# Patient Record
Sex: Female | Born: 1960 | Race: Black or African American | Hispanic: No | Marital: Married | Smoking: Former smoker
Health system: Southern US, Community
[De-identification: ages and names within clinical notes are randomized; demographics above are authoritative.]

## PROBLEM LIST (undated history)

## (undated) DIAGNOSIS — F329 Major depressive disorder, single episode, unspecified: Secondary | ICD-10-CM

## (undated) DIAGNOSIS — D219 Benign neoplasm of connective and other soft tissue, unspecified: Secondary | ICD-10-CM

## (undated) DIAGNOSIS — M1990 Unspecified osteoarthritis, unspecified site: Secondary | ICD-10-CM

## (undated) DIAGNOSIS — M069 Rheumatoid arthritis, unspecified: Secondary | ICD-10-CM

## (undated) DIAGNOSIS — D649 Anemia, unspecified: Secondary | ICD-10-CM

## (undated) DIAGNOSIS — R51 Headache: Secondary | ICD-10-CM

## (undated) DIAGNOSIS — F32A Depression, unspecified: Secondary | ICD-10-CM

## (undated) DIAGNOSIS — F419 Anxiety disorder, unspecified: Secondary | ICD-10-CM

## (undated) HISTORY — DX: Major depressive disorder, single episode, unspecified: F32.9

## (undated) HISTORY — DX: Depression, unspecified: F32.A

## (undated) HISTORY — PX: APPENDECTOMY: SHX54

## (undated) HISTORY — PX: ABDOMINAL SURGERY: SHX537

---

## 2008-06-17 ENCOUNTER — Emergency Department (HOSPITAL_COMMUNITY): Admission: EM | Admit: 2008-06-17 | Discharge: 2008-06-17 | Payer: Self-pay | Admitting: Family Medicine

## 2008-08-11 ENCOUNTER — Ambulatory Visit: Payer: Self-pay | Admitting: Gynecology

## 2008-08-31 ENCOUNTER — Ambulatory Visit: Payer: Self-pay | Admitting: Gynecology

## 2010-01-11 ENCOUNTER — Emergency Department (HOSPITAL_COMMUNITY)
Admission: EM | Admit: 2010-01-11 | Discharge: 2010-01-11 | Payer: Self-pay | Source: Home / Self Care | Admitting: Emergency Medicine

## 2010-05-07 ENCOUNTER — Emergency Department (HOSPITAL_COMMUNITY)
Admission: EM | Admit: 2010-05-07 | Discharge: 2010-05-07 | Disposition: A | Discharge status: Home / Self Care | Payer: Self-pay | Attending: Emergency Medicine | Admitting: Emergency Medicine

## 2010-05-07 ENCOUNTER — Emergency Department (HOSPITAL_COMMUNITY): Payer: Self-pay

## 2010-05-07 DIAGNOSIS — M25579 Pain in unspecified ankle and joints of unspecified foot: Secondary | ICD-10-CM | POA: Insufficient documentation

## 2010-05-07 DIAGNOSIS — W19XXXA Unspecified fall, initial encounter: Secondary | ICD-10-CM | POA: Insufficient documentation

## 2010-05-09 LAB — URINE CULTURE: Colony Count: 40000

## 2010-05-09 LAB — POCT URINALYSIS DIP (DEVICE)
Bilirubin Urine: NEGATIVE
Glucose, UA: NEGATIVE mg/dL
Hgb urine dipstick: NEGATIVE
Ketones, ur: NEGATIVE mg/dL
Nitrite: NEGATIVE
Protein, ur: NEGATIVE mg/dL
Specific Gravity, Urine: 1.025 (ref 1.005–1.030)
Urobilinogen, UA: 1 mg/dL (ref 0.0–1.0)
pH: 6 (ref 5.0–8.0)

## 2010-05-09 LAB — POCT PREGNANCY, URINE: Preg Test, Ur: NEGATIVE

## 2011-10-23 ENCOUNTER — Encounter (HOSPITAL_COMMUNITY): Payer: Self-pay | Admitting: *Deleted

## 2011-10-23 ENCOUNTER — Emergency Department (INDEPENDENT_AMBULATORY_CARE_PROVIDER_SITE_OTHER)
Admission: EM | Admit: 2011-10-23 | Discharge: 2011-10-23 | Disposition: A | Discharge status: Home / Self Care | Payer: Self-pay | Source: Home / Self Care | Attending: Family Medicine | Admitting: Family Medicine

## 2011-10-23 DIAGNOSIS — S335XXA Sprain of ligaments of lumbar spine, initial encounter: Secondary | ICD-10-CM

## 2011-10-23 DIAGNOSIS — N959 Unspecified menopausal and perimenopausal disorder: Secondary | ICD-10-CM

## 2011-10-23 DIAGNOSIS — S39012A Strain of muscle, fascia and tendon of lower back, initial encounter: Secondary | ICD-10-CM

## 2011-10-23 LAB — POCT URINALYSIS DIP (DEVICE)
Bilirubin Urine: NEGATIVE
Glucose, UA: NEGATIVE mg/dL
Hgb urine dipstick: NEGATIVE
Ketones, ur: NEGATIVE mg/dL
Nitrite: NEGATIVE
Protein, ur: NEGATIVE mg/dL
Specific Gravity, Urine: 1.02 (ref 1.005–1.030)
Urobilinogen, UA: 0.2 mg/dL (ref 0.0–1.0)
pH: 5.5 (ref 5.0–8.0)

## 2011-10-23 NOTE — ED Notes (Signed)
Pt reports no period since may with periods of hot flashes. Pelvic and lower back pain.

## 2011-10-23 NOTE — ED Provider Notes (Signed)
History     CSN: [id number]  Arrival date & time 10/23/11  1027   First MD Initiated Contact with Patient 10/23/11 1106      Chief Complaint  Patient presents with  . Back Pain  . Pelvic Pain    (Consider location/radiation/quality/duration/timing/severity/associated sxs/prior treatment) Patient is a 51 y.o. female presenting with back pain, pelvic pain, and female genitourinary complaint. The history is provided by the patient.  Back Pain  This is a new problem. The current episode started more than 1 week ago. The problem has not changed since onset.The pain is associated with lifting heavy objects (lifts a gentleman with alzheimers). The pain is present in the lumbar spine. Associated symptoms include pelvic pain. Pertinent negatives include no dysuria.  Pelvic Pain  Female GU Problem Primary symptoms include pelvic pain.  Primary symptoms include no discharge, no dyspareunia, no dysuria and no vaginal bleeding. There has been no fever. The problem has not changed since onset.She is not pregnant. Her LMP was months ago (may 2013, irreg prior to stopping.). The patient's menstrual history has been irregular. Pertinent negatives include no frequency. Associated symptoms comments: Hot flashes, mood swings- irritable, sleep problems, nerves are bad.. Treatments tried: in 2010 had 3 injections to melt fibroids, no assoc problems. There is no concern regarding sexually transmitted diseases. She uses nothing for contraception.    History reviewed. No pertinent past medical history.  Past Surgical History  Procedure Date  . Abdominal surgery     Family History  Problem Relation Age of Onset  . Family history unknown: Yes    History  Substance Use Topics  . Smoking status: Current Every Day Smoker -- 0.5 packs/day  . Smokeless tobacco: Not on file  . Alcohol Use: No    OB History    Grav Para Term Preterm Abortions TAB SAB Ect Mult Living                  Review of Systems   Constitutional: Negative.   Genitourinary: Positive for pelvic pain. Negative for dysuria, urgency, frequency, vaginal bleeding, vaginal discharge and dyspareunia.  Musculoskeletal: Positive for back pain.    Allergies  Review of patient's allergies indicates no known allergies.  Home Medications  No current outpatient prescriptions on file.  BP 132/88  Pulse 86  Temp 98.2 F (36.8 C) (Oral)  Resp 18  SpO2 96%  LMP 06/22/2011  Physical Exam  Nursing note and vitals reviewed. Constitutional: She is oriented to person, place, and time. She appears well-developed and well-nourished.  Abdominal: Soft. Bowel sounds are normal. She exhibits no distension and no mass. There is no hepatosplenomegaly. There is tenderness in the suprapubic area. There is no rebound, no guarding and no CVA tenderness.  Neurological: She is alert and oriented to person, place, and time.  Skin: Skin is warm.    ED Course  Procedures (including critical care time)  Labs Reviewed  POCT URINALYSIS DIP (DEVICE) - Abnormal; Notable for the following:    Leukocytes, UA SMALL (*)  Biochemical Testing Only. Please order routine urinalysis from main lab if confirmatory testing is needed.   All other components within normal limits   No results found.   1. Menopausal disorder   2. Low back strain       MDM          Linna Hoff, MD 10/23/11 1243

## 2011-10-24 ENCOUNTER — Inpatient Hospital Stay (HOSPITAL_COMMUNITY): Payer: Self-pay

## 2011-10-24 ENCOUNTER — Encounter (HOSPITAL_COMMUNITY): Payer: Self-pay | Admitting: *Deleted

## 2011-10-24 ENCOUNTER — Inpatient Hospital Stay (HOSPITAL_COMMUNITY)
Admission: AD | Admit: 2011-10-24 | Discharge: 2011-10-24 | Disposition: A | Discharge status: Home / Self Care | Payer: Self-pay | Source: Ambulatory Visit | Attending: Obstetrics & Gynecology | Admitting: Obstetrics & Gynecology

## 2011-10-24 DIAGNOSIS — A599 Trichomoniasis, unspecified: Secondary | ICD-10-CM

## 2011-10-24 DIAGNOSIS — N949 Unspecified condition associated with female genital organs and menstrual cycle: Secondary | ICD-10-CM

## 2011-10-24 DIAGNOSIS — D219 Benign neoplasm of connective and other soft tissue, unspecified: Secondary | ICD-10-CM

## 2011-10-24 DIAGNOSIS — N951 Menopausal and female climacteric states: Secondary | ICD-10-CM

## 2011-10-24 DIAGNOSIS — D259 Leiomyoma of uterus, unspecified: Secondary | ICD-10-CM | POA: Insufficient documentation

## 2011-10-24 DIAGNOSIS — N926 Irregular menstruation, unspecified: Secondary | ICD-10-CM | POA: Insufficient documentation

## 2011-10-24 DIAGNOSIS — R102 Pelvic and perineal pain: Secondary | ICD-10-CM

## 2011-10-24 DIAGNOSIS — R109 Unspecified abdominal pain: Secondary | ICD-10-CM | POA: Insufficient documentation

## 2011-10-24 DIAGNOSIS — A5901 Trichomonal vulvovaginitis: Secondary | ICD-10-CM | POA: Insufficient documentation

## 2011-10-24 HISTORY — DX: Headache: R51

## 2011-10-24 HISTORY — DX: Anemia, unspecified: D64.9

## 2011-10-24 HISTORY — DX: Benign neoplasm of connective and other soft tissue, unspecified: D21.9

## 2011-10-24 HISTORY — DX: Anxiety disorder, unspecified: F41.9

## 2011-10-24 LAB — WET PREP, GENITAL: Yeast Wet Prep HPF POC: NONE SEEN

## 2011-10-24 MED ORDER — METRONIDAZOLE 500 MG PO TABS: 500.0000 mg | ORAL_TABLET | Freq: Two times a day (BID) | ORAL | Status: DC

## 2011-10-24 MED ORDER — KETOROLAC TROMETHAMINE 60 MG/2ML IM SOLN
60.0000 mg | Freq: Once | INTRAMUSCULAR | Status: AC
  Administered 2011-10-24: 60 mg via INTRAMUSCULAR
  Filled 2011-10-24: qty 2

## 2011-10-24 MED ORDER — OXYCODONE-ACETAMINOPHEN 5-325 MG PO TABS: 1.0000 | ORAL_TABLET | Freq: Four times a day (QID) | ORAL | Status: DC | PRN

## 2011-10-24 NOTE — MAU Note (Signed)
2010 was given injection to "melt fibroids". Lost insurance has not had follow up.  Pain in pelvis around to back.  Hurting for past month, has gotten steadily worse-esp with helping pts at work.  No period since May.  Hot flashes, no patience, feel anxious, can't sleep.

## 2011-10-24 NOTE — MAU Provider Note (Signed)
History     CSN: [id number]  Arrival date and time: 10/24/11 1419   First Provider Initiated Contact with Patient 10/24/11 1539      Chief Complaint  Patient presents with  . Back Pain  . Amenorrhea   HPI Debra Everett is 51 y.o. [phone] Unknown weeks presenting with feeling bad with lower abdominal pain that radiates to her back.  She is having hot flashes, mood changes, nerves are "real bad".  LMP 5/34/13.  States she saw a doctor in 2010 that gave her injections to melt fibroids X 2.  Her insurance ran out and has not had followup       Past Medical History  Diagnosis Date  . Headache   . Anemia     as teenager  . Fibroid   . Anxiety     Past Surgical History  Procedure Date  . Abdominal surgery   . Appendectomy     Family History  Problem Relation Age of Onset  . Other Neg Hx     History  Substance Use Topics  . Smoking status: Current Every Day Smoker -- 0.5 packs/day for 36 years    Types: Cigarettes  . Smokeless tobacco: Not on file  . Alcohol Use: No    Allergies: No Known Allergies  No prescriptions prior to admission    Review of Systems  Constitutional: Negative.   HENT: Negative.   Gastrointestinal: Positive for abdominal pain (lower abdominal pain).  Genitourinary:       Neg for vaginal bleeding  Musculoskeletal: Positive for back pain.  Psychiatric/Behavioral:       + mood changes   Physical Exam   Blood pressure 126/81, pulse 86, temperature 97.9 F (36.6 C), temperature source Oral, resp. rate 18, height 5\' 2"  (1.575 m), weight 94.711 kg (208 lb 12.8 oz), last menstrual period 06/22/2011, SpO2 99.00%.  Physical Exam  Constitutional: She is oriented to person, place, and time. She appears well-developed and well-nourished. No distress.  HENT:  Head: Normocephalic.  Neck: Normal range of motion.  Cardiovascular: Normal rate.   Respiratory: Effort normal.  GI: Soft. She exhibits no distension and no mass. There is tenderness  (lower abdominal tenderness on exam). There is no rebound and no guarding.  Genitourinary: Uterus is enlarged (nodular) and tender. Cervix exhibits no motion tenderness, no discharge and no friability. Right adnexum displays tenderness (mild). Left adnexum displays tenderness (mild). No bleeding around the vagina. Vaginal discharge (frothy discharge with odor) found.  Neurological: She is alert and oriented to person, place, and time.  Skin: Skin is warm and dry.  Psychiatric: She has a normal mood and affect. Her behavior is normal.   TRANSABDOMINAL AND TRANSVAGINAL ULTRASOUND OF PELVIS  Technique: Both transabdominal and transvaginal ultrasound  examinations of the pelvis were performed. Transabdominal technique  was performed for global imaging of the pelvis including uterus,  ovaries, adnexal regions, and pelvic cul-de-sac.  It was necessary to proceed with endovaginal exam following the  transabdominal exam to visualize the endometrium.  Comparison: None  Findings:  Uterus: Measures 9.7 x 7.4 x 7.3 cm. Notable for at least two  fibroids, as follows:  --6.9 x 4.6 x 5.6 cm subserosal anterior fundal fibroid  --6.6 x 6.2 x 5.7 cm subserosal right uterine body fibroid  Endometrium: Distorted by fibroids, measuring up to 10 mm.  Right ovary: Normal appearance/no adnexal mass, measuring 2.5 x  1.4 x 2.4 cm.  Left ovary: Measures 5.2 x 2.9 x 5.5 cm  and is notable for a 3.4 cm  simple cyst / follicle.  Other findings: Trace free fluid.  IMPRESSION:  Two uterine fibroids, measuring up to 6.9 cm, as described above.  3.4 cm left ovarian cyst / follicle, likely physiologic.  Original Report Authenticated By: Charline Bills, M.D.    Results for orders placed during the hospital encounter of 10/24/11 (from the past 24 hour(s))  WET PREP, GENITAL     Status: Abnormal   Collection Time   10/24/11  3:56 PM      Component Value Range   Yeast Wet Prep HPF POC NONE SEEN  NONE SEEN   Trich, Wet  Prep FEW (*) NONE SEEN   Clue Cells Wet Prep HPF POC FEW (*) NONE SEEN   WBC, Wet Prep HPF POC FEW (*) NONE SEEN     MAU Course  Procedures  GC/CHL culture to lab  MDM Toradol 60mg  IM given in MAU Patient is feeling much better after medication.   Assessment and Plan  A:  Trichomonas vaginitis      Uterine fibroids     Peri-menopausal symptoms of hot flashes, mood changes and menstrual irregularity  P:  Rx for Flagyl 500mg  bid X 1 week     Rx for Percocet 5/325mg  #20 prn     Appointment requested in the GYN CLINIC for further evaluation and treatment  Ersel Wadleigh,EVE M 10/24/2011, 3:42 PM

## 2011-10-25 LAB — GC/CHLAMYDIA PROBE AMP, GENITAL
Chlamydia, DNA Probe: NEGATIVE
GC Probe Amp, Genital: NEGATIVE

## 2011-11-07 ENCOUNTER — Emergency Department (HOSPITAL_COMMUNITY)
Admission: EM | Admit: 2011-11-07 | Discharge: 2011-11-07 | Disposition: A | Discharge status: Home / Self Care | Payer: Self-pay | Attending: Emergency Medicine | Admitting: Emergency Medicine

## 2011-11-07 ENCOUNTER — Encounter (HOSPITAL_COMMUNITY): Payer: Self-pay | Admitting: Emergency Medicine

## 2011-11-07 DIAGNOSIS — R109 Unspecified abdominal pain: Secondary | ICD-10-CM | POA: Insufficient documentation

## 2011-11-07 DIAGNOSIS — N949 Unspecified condition associated with female genital organs and menstrual cycle: Secondary | ICD-10-CM | POA: Insufficient documentation

## 2011-11-07 DIAGNOSIS — F411 Generalized anxiety disorder: Secondary | ICD-10-CM | POA: Insufficient documentation

## 2011-11-07 DIAGNOSIS — R102 Pelvic and perineal pain: Secondary | ICD-10-CM

## 2011-11-07 DIAGNOSIS — N898 Other specified noninflammatory disorders of vagina: Secondary | ICD-10-CM | POA: Insufficient documentation

## 2011-11-07 DIAGNOSIS — M549 Dorsalgia, unspecified: Secondary | ICD-10-CM | POA: Insufficient documentation

## 2011-11-07 DIAGNOSIS — F17200 Nicotine dependence, unspecified, uncomplicated: Secondary | ICD-10-CM | POA: Insufficient documentation

## 2011-11-07 LAB — URINALYSIS, ROUTINE W REFLEX MICROSCOPIC
Bilirubin Urine: NEGATIVE
Glucose, UA: NEGATIVE mg/dL
Ketones, ur: NEGATIVE mg/dL
Nitrite: NEGATIVE
Protein, ur: NEGATIVE mg/dL
Specific Gravity, Urine: 1.021 (ref 1.005–1.030)
Urobilinogen, UA: 1 mg/dL (ref 0.0–1.0)
pH: 6 (ref 5.0–8.0)

## 2011-11-07 LAB — URINE MICROSCOPIC-ADD ON

## 2011-11-07 LAB — PREGNANCY, URINE: Preg Test, Ur: NEGATIVE

## 2011-11-07 MED ORDER — OXYCODONE-ACETAMINOPHEN 5-325 MG PO TABS: 1.0000 | ORAL_TABLET | Freq: Four times a day (QID) | ORAL | Status: DC | PRN

## 2011-11-07 MED ORDER — MORPHINE SULFATE 4 MG/ML IJ SOLN
4.0000 mg | Freq: Once | INTRAMUSCULAR | Status: AC
  Administered 2011-11-07: 4 mg via INTRAVENOUS
  Filled 2011-11-07: qty 1

## 2011-11-07 NOTE — ED Notes (Signed)
Report given to Barbara RN

## 2011-11-07 NOTE — ED Notes (Signed)
Cramping in abd and back has appointment next week but pain is bad has back pain also no period x 6 months

## 2011-11-07 NOTE — ED Notes (Signed)
Informed patient of the need for a pelvic exam.  Patient and family upset regarding plan of care, stating, "I thought they could see all my records cause I just had all this done.  I just need something for pain."  Informed patient of pain medication orders and reason for delay at this time.  Patient declined pelvic exam at this time, stating, "I go to the OB/GYN on Monday and they will do the same things again.  I am bleeding, and I am hurting.  That is just too much looking down there."  Patient's family members upset regarding length of stay in the department, stating that "This is crazy, I don't understand why it takes so long for someone to be seen by the doctor.  I bet if I call the news to investigate then they sure will speed things up!"  Educated patient and family on Emergency Department flow and reason for delay.

## 2011-11-07 NOTE — ED Notes (Signed)
EDP Powers informed that patient declined pelvic exam and patient expectations for ED visit at this time.

## 2011-11-07 NOTE — ED Notes (Signed)
EDP Powers at bedside to speak with patient and family regarding plan of care.

## 2011-11-07 NOTE — ED Provider Notes (Signed)
History     CSN: [id number]  Arrival date & time 11/07/11  1302   First MD Initiated Contact with Patient 11/07/11 1846      No chief complaint on file.   (Consider location/radiation/quality/duration/timing/severity/associated sxs/prior treatment) HPI Comments: [name] presents for evaluation of pelvic pain.  She reports severe lower abdominal pain that wraps around to her back.  The pain began as cramping similar to menstrual cramping.  She had 2-3 days of spotting then a brief period of heavy flow.  She has no reported bleeding now but has continued pain.  She experienced similar pain several weeks ago, was evaluated, and found to have fibroids.  The pain improved and she was prescribed a narcotic which she used prn.  She exhausted the prescription several days ago.  She reports she had a period of almost 6 months without a period and has been informed that this is likely secondary to early menopause.  Patient is a 51 y.o. female presenting with abdominal pain. The history is provided by the patient. No language interpreter was used.  Abdominal Pain The primary symptoms of the illness include abdominal pain and vaginal bleeding. The primary symptoms of the illness do not include fever, fatigue, shortness of breath, nausea, vomiting, diarrhea, hematemesis, hematochezia, dysuria or vaginal discharge. The current episode started more than 2 days ago. The onset of the illness was gradual. The problem has been gradually worsening.  The patient states that she believes she is currently not pregnant. The patient has not had a change in bowel habit. Additional symptoms associated with the illness include back pain. Symptoms associated with the illness do not include chills, anorexia, diaphoresis, heartburn, constipation, urgency, hematuria or frequency.    Past Medical History  Diagnosis Date  . Headache   . Anemia     as teenager  . Fibroid   . Anxiety     Past Surgical History    Procedure Date  . Abdominal surgery   . Appendectomy     Family History  Problem Relation Age of Onset  . Other Neg Hx     History  Substance Use Topics  . Smoking status: Current Every Day Smoker -- 0.5 packs/day for 36 years    Types: Cigarettes  . Smokeless tobacco: Not on file  . Alcohol Use: No    OB History    Grav Para Term Preterm Abortions TAB SAB Ect Mult Living   3 3 3  0 0 0 0 0 0 2      Review of Systems  Constitutional: Negative for fever, chills, diaphoresis and fatigue.  HENT: Negative.   Eyes: Negative.   Respiratory: Negative.  Negative for shortness of breath.   Cardiovascular: Negative.   Gastrointestinal: Positive for abdominal pain. Negative for heartburn, nausea, vomiting, diarrhea, constipation, blood in stool, hematochezia, abdominal distention, anorexia and hematemesis.  Genitourinary: Positive for vaginal bleeding, menstrual problem and pelvic pain. Negative for dysuria, urgency, frequency, hematuria, flank pain, decreased urine volume, vaginal discharge, difficulty urinating, genital sores and vaginal pain.  Musculoskeletal: Positive for back pain.  Skin: Negative.   Neurological: Negative.   Hematological: Negative.   Psychiatric/Behavioral: Negative.     Allergies  Review of patient's allergies indicates no known allergies.  Home Medications   Current Outpatient Rx  Name Route Sig Dispense Refill  . OXYCODONE-ACETAMINOPHEN 5-325 MG PO TABS Oral Take 1-2 tablets by mouth every 6 (six) hours as needed for pain. 20 tablet 0    BP 106/84  Pulse 77  Temp 98.8 F (37.1 C) (Oral)  Resp 18  SpO2 100%  LMP 06/22/2011  Physical Exam  Nursing note and vitals reviewed. Constitutional: She is oriented to person, place, and time. She appears well-developed and well-nourished. No distress.  HENT:  Head: Normocephalic and atraumatic.  Right Ear: External ear normal.  Left Ear: External ear normal.  Nose: Nose normal.  Mouth/Throat:  Oropharynx is clear and moist. No oropharyngeal exudate.  Eyes: Conjunctivae normal are normal. Pupils are equal, round, and reactive to light. Right eye exhibits no discharge. Left eye exhibits no discharge. No scleral icterus.  Neck: Normal range of motion. Neck supple. No JVD present. No tracheal deviation present.  Cardiovascular: Normal rate, regular rhythm, normal heart sounds and intact distal pulses.  Exam reveals no gallop and no friction rub.   No murmur heard. Pulmonary/Chest: Effort normal and breath sounds normal. No stridor. No respiratory distress. She has no wheezes. She has no rales. She exhibits no tenderness.  Abdominal: Soft. Bowel sounds are normal. She exhibits no distension and no mass. There is tenderness in the suprapubic area. There is no rigidity, no rebound, no guarding, no CVA tenderness, no tenderness at McBurney's point and negative Murphy's sign.  Musculoskeletal: Normal range of motion. She exhibits no edema and no tenderness.  Lymphadenopathy:    She has no cervical adenopathy.  Neurological: She is alert and oriented to person, place, and time.  Skin: Skin is warm and dry. No rash noted. She is not diaphoretic. No erythema. No pallor.  Psychiatric: She has a normal mood and affect. Her behavior is normal.    ED Course  Procedures (including critical care time)  Labs Reviewed  URINALYSIS, ROUTINE W REFLEX MICROSCOPIC - Abnormal; Notable for the following:    Hgb urine dipstick LARGE (*)     Leukocytes, UA SMALL (*)     All other components within normal limits  URINE MICROSCOPIC-ADD ON - Abnormal; Notable for the following:    Squamous Epithelial / LPF FEW (*)     All other components within normal limits  PREGNANCY, URINE  GC/CHLAMYDIA PROBE AMP, GENITAL  WET PREP, GENITAL   No results found.   No diagnosis found.    MDM  Pt presents for evaluation of pelvic pain.  She appears nontoxic, note stable VS, NAD.  Pt has had similar pain recently  secondary to fibroids and is out of pain medication.  Plan pain mgmnt, pelvic exam, reassess.  May obtain an u./s if indicated.  2100.  Pt's pain is improved.  She refuses to have a pelvic exam performed.  She reiterates the only reason she came to the ER is because she ran out of pain medication.  She states clearly that this pain is the same as the discomfort she has experienced intermittently for a while now and is related to uterine fibroids.  She has a follow-up appointment scheduled for 10/14.  Pt advised that my evaluation is limited secondary to her not wanting to have a pelvic exam.  She has no clinical evidence of peritonitis.  Plan d/c home.  Will provide short course of prn percocet.      Tobin Chad, MD 11/07/11 2129

## 2011-11-07 NOTE — ED Notes (Signed)
Dr Lorenso Courier in to talk with patient.

## 2011-11-13 ENCOUNTER — Telehealth: Payer: Self-pay | Admitting: Medical

## 2011-11-13 NOTE — Telephone Encounter (Signed)
Patient called stating she needed a medication refill. Chart reviewed was seen in MAU on 10/24/11 and received Rx for flagyl and percocet. Patient has f/u in clinic on 11/19/11.

## 2011-11-14 NOTE — Telephone Encounter (Signed)
Called pt and left message to return the call to the clinics.  

## 2011-11-14 NOTE — Telephone Encounter (Signed)
Called pt and informed pt that due to the fact that she has not been seen our provider yet she would not be able to receive a refill on her narcotic.  She would need to be evaluated by a provider before a pain medication can be prescribed and I offered her to take tylenol or ibuprofen for her pain until she comes to appt scheduled 11/19/11 @ 330pm.  Pt stated understanding and did not have any further questions.

## 2011-11-19 ENCOUNTER — Ambulatory Visit (INDEPENDENT_AMBULATORY_CARE_PROVIDER_SITE_OTHER): Payer: Self-pay | Admitting: Obstetrics & Gynecology

## 2011-11-19 ENCOUNTER — Encounter: Payer: Self-pay | Admitting: Obstetrics & Gynecology

## 2011-11-19 VITALS — BP 136/90 | HR 99 | Temp 97.8°F | Ht 62.0 in | Wt 213.5 lb

## 2011-11-19 DIAGNOSIS — G8929 Other chronic pain: Secondary | ICD-10-CM | POA: Insufficient documentation

## 2011-11-19 DIAGNOSIS — F419 Anxiety disorder, unspecified: Secondary | ICD-10-CM | POA: Insufficient documentation

## 2011-11-19 DIAGNOSIS — D219 Benign neoplasm of connective and other soft tissue, unspecified: Secondary | ICD-10-CM | POA: Insufficient documentation

## 2011-11-19 DIAGNOSIS — F411 Generalized anxiety disorder: Secondary | ICD-10-CM

## 2011-11-19 DIAGNOSIS — D259 Leiomyoma of uterus, unspecified: Secondary | ICD-10-CM

## 2011-11-19 LAB — CBC
HCT: 41.3 % (ref 36.0–46.0)
Hemoglobin: 14.6 g/dL (ref 12.0–15.0)
MCH: 32.1 pg (ref 26.0–34.0)
MCHC: 35.4 g/dL (ref 30.0–36.0)
MCV: 90.8 fL (ref 78.0–100.0)
Platelets: 352 10*3/uL (ref 150–400)
RBC: 4.55 MIL/uL (ref 3.87–5.11)
RDW: 14.5 % (ref 11.5–15.5)
WBC: 5.5 10*3/uL (ref 4.0–10.5)

## 2011-11-19 MED ORDER — DICLOFENAC SODIUM 75 MG PO TBEC: 75.0000 mg | DELAYED_RELEASE_TABLET | Freq: Two times a day (BID) | ORAL | Status: DC

## 2011-11-19 MED ORDER — TRAMADOL-ACETAMINOPHEN 37.5-325 MG PO TABS: 1.0000 | ORAL_TABLET | Freq: Four times a day (QID) | ORAL | Status: DC | PRN

## 2011-11-19 MED ORDER — AMITRIPTYLINE HCL 25 MG PO TABS: 25.0000 mg | ORAL_TABLET | Freq: Every day | ORAL | Status: DC

## 2011-11-19 NOTE — Patient Instructions (Addendum)
Uterine Fibroid  A uterine fibroid is a growth (tumor) that occurs in a woman's uterus. This type of tumor is not cancerous and does not spread out of the uterus. A woman can have one or many fibroids, and the fiboid(s) can become quite large. A fibroid can vary in size, weight, and where it grows in the uterus. Most fibroids do not require medical treatment, but some can cause pain or heavy bleeding during and between periods.  CAUSES   A fibroid is the result of a single uterine cell that keeps growing (unregulated), which is different than most cells in the human body. Most cells have a control mechanism that keeps them from reproducing without control.   SYMPTOMS    Bleeding.   Pelvic pain and pressure.   Bladder problems due to the size of the fibroid.   Infertility and miscarriages depending on the size and location of the fibroid.  DIAGNOSIS   A diagnosis is made by physical exam. Your caregiver may feel the lumpy tumors during a pelvic exam. Important information regarding size, location, and number of tumors can be gained by having an ultrasound. It is rare that other tests, such as a CT scan or MRI, are needed.  TREATMENT    Your caregiver may recommend watchful waiting. This involves getting the fibroid checked by your caregiver to see if the fibroids grow or shrink.    Hormonal treatment or an intrauterine device (IUD) may be prescribed.    Surgery may be needed to remove the fibroids (myomectomy) or the uterus (hysterectomy). This depends on your situation.  When fibroids interfere with fertility and a woman wants to become pregnant, a caregiver may recommend having the fibroids removed.   HOME CARE INSTRUCTIONS   Home care depends on how you were treated. In general:    Keep all follow-up appointments with your caregiver.    Only take medicine as told by your caregiver. Do not take aspirin. It can cause bleeding.    If you have excessive periods and soak tampons or pads in a half hour or  less, contact your caregiver immediately. If your periods are troublesome but not so heavy, lie down with your feet raised slightly above your heart. Place cold packs on your lower abdomen.    If your periods are heavy, write down the number of pads or tampons you use per month. Bring this information to your caregiver.    Talk to your caregiver about taking iron pills.    Include green vegetables in your diet.    If you were prescribed a hormonal treatment, take the hormonal medicines as directed.    If you need surgery, ask your caregiver for information on your specific surgery.   SEEK IMMEDIATE MEDICAL CARE IF:   You have pelvic pain or cramps not controlled with medicines.    You have a sudden increase in pelvic pain.    You have an increase of bleeding between and during periods.    You feel lightheaded or have fainting episodes.   MAKE SURE YOU:   Understand these instructions.   Will watch your condition.   Will get help right away if you are not doing well or get worse.  Document Released: 01/13/2000 Document Revised: 04/09/2011 Document Reviewed: 02/05/2011  [employer] Patient Information 2013 [employer]

## 2011-11-19 NOTE — Progress Notes (Addendum)
Patient ID: Debra Everett, female   DOB: 28-Aug-1960, 51 y.o.   MRN: [id number]  Chief Complaint  Patient presents with  . Menopause    having menopausal symptoms  . Fibroids    constant pain    HPI Debra Everett is a 51 y.o. female.  She was seen in the [facility] at the end of September and early October for lower abdominal pain. She says she's had similar pain for about the last 6 months. She had her last period in May and then began again the first part of October. This was not heavy flow. Said her pain was somewhat worse during the menstrual cycle. She was known to have fibroid uterus and was diagnosed several years ago. She received injections which may have been Lupron Depot. She has hot flushes and night sweats. She has problems with her nerves and with control of her emotions. She's been treated for anxiety and depression in the past especially after the death of her son several years ago. HPI  Past Medical History  Diagnosis Date  . Headache   . Anemia     as teenager  . Fibroid   . Anxiety     Past Surgical History  Procedure Date  . Abdominal surgery   . Appendectomy     Family History  Problem Relation Age of Onset  . Other Neg Hx     Social History History  Substance Use Topics  . Smoking status: Current Every Day Smoker -- 0.5 packs/day for 36 years    Types: Cigarettes  . Smokeless tobacco: Not on file  . Alcohol Use: No    No Known Allergies  Current Outpatient Prescriptions  Medication Sig Dispense Refill  . amitriptyline (ELAVIL) 25 MG tablet Take 1 tablet (25 mg total) by mouth at bedtime.  30 tablet  3  . diclofenac (VOLTAREN) 75 MG EC tablet Take 1 tablet (75 mg total) by mouth 2 (two) times daily with a meal.  60 tablet  2  . oxyCODONE-acetaminophen (PERCOCET) 5-325 MG per tablet Take 1 tablet by mouth every 6 (six) hours as needed for pain.  12 tablet  0  . oxyCODONE-acetaminophen (PERCOCET/ROXICET) 5-325 MG per tablet Take 1-2 tablets by  mouth every 6 (six) hours as needed for pain.  20 tablet  0  . traMADol-acetaminophen (ULTRACET) 37.5-325 MG per tablet Take 1 tablet by mouth every 6 (six) hours as needed for pain.  30 tablet  0    Review of Systems Review of Systems  Constitutional: Positive for unexpected weight change (weight gain).  Gastrointestinal: Positive for abdominal pain. Negative for nausea, diarrhea, constipation, abdominal distention and anal bleeding.  Genitourinary: Positive for menstrual problem and pelvic pain. Negative for dysuria and difficulty urinating.  Musculoskeletal: Positive for myalgias and back pain.  Psychiatric/Behavioral: Positive for dysphoric mood and agitation. The patient is nervous/anxious.     Blood pressure 136/90, pulse 99, temperature 97.8 F (36.6 C), temperature source Oral, height 5\' 2"  (1.575 m), weight 213 lb 8 oz (96.843 kg), last menstrual period 11/08/2011.  Physical Exam Physical Exam  Constitutional: She appears well-developed. She appears distressed (mildly anxious).  Abdominal: Soft. She exhibits mass (Suprapubic fullness). There is tenderness (lower quadrant tenderness). There is no rebound and no guarding.  Genitourinary: No vaginal discharge found.       Uterus about 12 weeks' size mildly tender, adnexa are not well-defined Pap was done  Skin: Skin is warm.    Data Reviewed *RADIOLOGY REPORT*  Clinical Data: Lower abdominal pain, history of fibroids  TRANSABDOMINAL AND TRANSVAGINAL ULTRASOUND OF PELVIS  Technique: Both transabdominal and transvaginal ultrasound  examinations of the pelvis were performed. Transabdominal technique  was performed for global imaging of the pelvis including uterus,  ovaries, adnexal regions, and pelvic cul-de-sac.  It was necessary to proceed with endovaginal exam following the  transabdominal exam to visualize the endometrium.  Comparison: None  Findings:  Uterus: Measures 9.7 x 7.4 x 7.3 cm. Notable for at least two    fibroids, as follows:  --6.9 x 4.6 x 5.6 cm subserosal anterior fundal fibroid  --6.6 x 6.2 x 5.7 cm subserosal right uterine body fibroid  Endometrium: Distorted by fibroids, measuring up to 10 mm.  Right ovary: Normal appearance/no adnexal mass, measuring 2.5 x  1.4 x 2.4 cm.  Left ovary: Measures 5.2 x 2.9 x 5.5 cm and is notable for a 3.4 cm  simple cyst / follicle.  Other findings: Trace free fluid.  IMPRESSION:  Two uterine fibroids, measuring up to 6.9 cm, as described above.  3.4 cm left ovarian cyst / follicle, likely physiologic.  Original Report Authenticated By: Charline Bills, M.D.   Lower, low and lower back and lower extremity pain, irregular periods and menopausal syndrome. Patient has chronic pain and history of depression and anxiety. She currently does not have a primary care physician. She says that the Percocet prescribed by the ED is all that has helped her pain. Assessment    Fibroid uterus menopause syndrome chronic pain    Plan    Pap smear was done today. Recommend she return for endometrial biopsy. Check a TSH CBC FSH today. Gave her prescription for Ultracet for pain. In addition I prescribed Voltaren 75 mg twice a day        Debra Everett 11/19/2011, 3:44 PM   Amitriptyline 25 mg po HS prescribed  Ezana Hubbert

## 2011-11-20 LAB — FOLLICLE STIMULATING HORMONE: FSH: 66 m[IU]/mL

## 2011-11-20 LAB — TSH: TSH: 1.604 u[IU]/mL (ref 0.350–4.500)

## 2011-11-21 ENCOUNTER — Telehealth: Payer: Self-pay | Admitting: *Deleted

## 2011-11-21 MED ORDER — OXYCODONE-ACETAMINOPHEN 5-325 MG PO TABS: ORAL_TABLET | ORAL | Status: DC

## 2011-11-21 NOTE — Telephone Encounter (Addendum)
Pt left message stating that she was seen in clinic on 10/21. The medication that she was prescribed is not helping. She would like to have the  medication that she was previously given. Please call back. I returned pt's call and she stated that she has been taking tramadol every 2 hrs and it has not helped her pain. I cautioned pt against taking this medication so frequently because she could cause damage to her liver. She may only take the tramadol every 6hrs as directed.  Pt also states that she has been taking the diclofenac twice daily as prescribed.  She is asking for Percocet Rx and states that when she takes this medication, she only needs it twice a Sarah [place]er[place]. This is the only thing that helps her pain.  I stated that i will speak w/Dr. Debroah Loop this afternoon and call her back. Pt voiced understanding of all information. 1600- called pt and informed her that Dr. Debroah Loop has given a Rx for Percocet # 15 tabs. She will not receive any additional narcotic Rx until she has evaluation @ her next appt on 12/06/11. At the time of her next visit it will be determined what medication if any she will receive. Pt voiced understanding and stated that she will come to clinic tomorrow to pick up the Rx.

## 2011-11-28 ENCOUNTER — Telehealth: Payer: Self-pay | Admitting: *Deleted

## 2011-11-28 NOTE — Telephone Encounter (Signed)
Message copied by Jill Side on Wed Nov 28, 2011 10:24 AM ------      Message from: Adam Phenix      Created: Tue Nov 27, 2011  4:06 PM       ASCUS neg HR HPV, repeat pap 1 year

## 2011-11-28 NOTE — Telephone Encounter (Signed)
Called and left message for pt to call back to the nurse voice mail and indicate when she can be reached to discuss results. Also, she may state whether or not we can leave the details of her results on her voice mail.

## 2011-11-29 NOTE — Telephone Encounter (Signed)
Called patient and left message for her to give [place] a call back so we could go over some information with her

## 2011-11-30 NOTE — Telephone Encounter (Signed)
Called patient and left message for her to give [place] a call back and that we close at 12 today

## 2011-12-03 ENCOUNTER — Encounter: Payer: Self-pay | Admitting: Sports Medicine

## 2011-12-03 ENCOUNTER — Ambulatory Visit (INDEPENDENT_AMBULATORY_CARE_PROVIDER_SITE_OTHER): Payer: Self-pay | Admitting: Sports Medicine

## 2011-12-03 VITALS — BP 125/87 | HR 92 | Temp 98.0°F | Ht 62.0 in | Wt 214.2 lb

## 2011-12-03 DIAGNOSIS — F17200 Nicotine dependence, unspecified, uncomplicated: Secondary | ICD-10-CM

## 2011-12-03 DIAGNOSIS — F329 Major depressive disorder, single episode, unspecified: Secondary | ICD-10-CM

## 2011-12-03 DIAGNOSIS — D259 Leiomyoma of uterus, unspecified: Secondary | ICD-10-CM

## 2011-12-03 DIAGNOSIS — F32A Depression, unspecified: Secondary | ICD-10-CM

## 2011-12-03 DIAGNOSIS — N920 Excessive and frequent menstruation with regular cycle: Secondary | ICD-10-CM

## 2011-12-03 DIAGNOSIS — G8929 Other chronic pain: Secondary | ICD-10-CM

## 2011-12-03 DIAGNOSIS — F411 Generalized anxiety disorder: Secondary | ICD-10-CM

## 2011-12-03 DIAGNOSIS — F419 Anxiety disorder, unspecified: Secondary | ICD-10-CM

## 2011-12-03 DIAGNOSIS — Z720 Tobacco use: Secondary | ICD-10-CM

## 2011-12-03 DIAGNOSIS — D219 Benign neoplasm of connective and other soft tissue, unspecified: Secondary | ICD-10-CM

## 2011-12-03 MED ORDER — OXYCODONE-ACETAMINOPHEN 5-325 MG PO TABS: ORAL_TABLET | ORAL | Status: DC

## 2011-12-03 MED ORDER — CITALOPRAM HYDROBROMIDE 20 MG PO TABS: 20.0000 mg | ORAL_TABLET | Freq: Every day | ORAL | Status: DC

## 2011-12-03 MED ORDER — TRAZODONE HCL 50 MG PO TABS: 25.0000 mg | ORAL_TABLET | Freq: Every day | ORAL | Status: DC

## 2011-12-03 NOTE — Telephone Encounter (Signed)
Called patient and notified of ASCUS result and repeat of pap smear in one year. Patient states understanding and satisfied.

## 2011-12-03 NOTE — Patient Instructions (Addendum)
It was nice to meet you.  Please follow up in 2 weeks.  Take the trazodone at night and the Celexa in the mornings.  Please make sure your follow up with Dr. Debroah Loop

## 2011-12-06 ENCOUNTER — Other Ambulatory Visit (HOSPITAL_COMMUNITY)
Admission: RE | Admit: 2011-12-06 | Discharge: 2011-12-06 | Disposition: A | Discharge status: Home / Self Care | Payer: Self-pay | Source: Ambulatory Visit | Attending: Obstetrics & Gynecology | Admitting: Obstetrics & Gynecology

## 2011-12-06 ENCOUNTER — Ambulatory Visit (INDEPENDENT_AMBULATORY_CARE_PROVIDER_SITE_OTHER): Payer: Self-pay | Admitting: Obstetrics & Gynecology

## 2011-12-06 VITALS — BP 115/84 | HR 108 | Temp 98.0°F | Ht 62.0 in | Wt 213.0 lb

## 2011-12-06 DIAGNOSIS — D259 Leiomyoma of uterus, unspecified: Secondary | ICD-10-CM | POA: Insufficient documentation

## 2011-12-06 DIAGNOSIS — N920 Excessive and frequent menstruation with regular cycle: Secondary | ICD-10-CM | POA: Insufficient documentation

## 2011-12-06 DIAGNOSIS — D219 Benign neoplasm of connective and other soft tissue, unspecified: Secondary | ICD-10-CM

## 2011-12-06 DIAGNOSIS — G8929 Other chronic pain: Secondary | ICD-10-CM

## 2011-12-06 LAB — POCT PREGNANCY, URINE: Preg Test, Ur: NEGATIVE

## 2011-12-06 MED ORDER — HYDROCODONE-ACETAMINOPHEN 5-500 MG PO TABS: 1.0000 | ORAL_TABLET | Freq: Four times a day (QID) | ORAL | Status: DC | PRN

## 2011-12-06 MED ORDER — TRAMADOL-ACETAMINOPHEN 37.5-325 MG PO TABS: 1.0000 | ORAL_TABLET | Freq: Four times a day (QID) | ORAL | Status: DC | PRN

## 2011-12-06 NOTE — Patient Instructions (Signed)

## 2011-12-06 NOTE — Progress Notes (Signed)
  Subjective:    Patient ID: Debra Everett, female    DOB: 07-12-1960, 51 y.o.   MRN: [id number]  HPIPatient's last menstrual period was 12/03/2011. [id number] Heavy period began 2 days ago. She is scheduled for endometrial biopsy today. She has worsening pelvic pain during her menses. She says that the amitriptyline is helping her sleep. She was also prescribed trazodone but she feels it makes her hallucinations.     Review of Systems  Genitourinary: Positive for vaginal bleeding, menstrual problem and pelvic pain.  Psychiatric/Behavioral: The patient is nervous/anxious.        Objective:   Physical Exam  Constitutional: No distress.  Genitourinary: Vagina normal. No vaginal discharge found.       Blood in the vaginal vault. Gave consent for endometrial biopsy and timeout was performed. Cervix was prepped with Betadine. The sampler was passed to about 10 cm and bloody material was obtained for pathology.  Skin: Skin is warm and dry.  Psychiatric: She has a normal mood and affect. Her behavior is normal.          Assessment & Plan:  Fibroid uterus and menometrorrhagia. She returned to review the results of the biopsy. She is applying for financial assistance and she is candidate for total abdominal hysterectomy.   Dr. Scheryl Darter 12/06/2011 4:34 PM

## 2011-12-17 ENCOUNTER — Ambulatory Visit: Payer: Self-pay | Admitting: Sports Medicine

## 2011-12-18 ENCOUNTER — Encounter: Payer: Self-pay | Admitting: Sports Medicine

## 2011-12-18 DIAGNOSIS — F329 Major depressive disorder, single episode, unspecified: Secondary | ICD-10-CM | POA: Insufficient documentation

## 2011-12-18 DIAGNOSIS — F32A Depression, unspecified: Secondary | ICD-10-CM | POA: Insufficient documentation

## 2011-12-18 DIAGNOSIS — Z720 Tobacco use: Secondary | ICD-10-CM | POA: Insufficient documentation

## 2011-12-18 NOTE — Assessment & Plan Note (Signed)
Defer to Dr. Debroah Loop for further evaluation and management

## 2011-12-18 NOTE — Progress Notes (Signed)
  [facility]  Patient name: ISAMARA ALMER MRN [id number]  Date of birth: 1960-06-04  CC & HPI:  DAWNIEL LASICH is a 51 y.o. female presenting today to establish care: She is a former patient of helps her.  Her medical conditions include:  # Depression & Anxiety:  Have been on trazodone Celexa for a long time.  Had previously been working well for her.  Amitriptyline at night only.  Has difficult time sleeping but does feel groggy throughout the day.  # Chronic pelvic pain, and uterine fibroids, menorrhagia.  She's been followed by Dr. Debroah Loop for this at this time and is currently being worked up.  Does appear from chart review that had planned on a complete hysterectomy with Dr. Lily Peer in 2010 but unclear as to lost to followup.  Reports Percocet is electing that helps this.  # Tobacco abuse: Smokes a pack a day.  ROS:  Reports abdominal pelvic pain, frequent urination, joint pain, stress anxiety.  Otherwise FPC 11 point review of systems negative.  Pertinent History Reviewed:  Medical & Surgical Hx:  Reviewed: Significant for appendectomy,  Medications: Reviewed & Updated - see associated section Social History: Reviewed -  reports that she has been smoking Cigarettes.  She has a 18 pack-year smoking history. She does not have any smokeless tobacco history on file.   Objective Findings:  Vitals:  Filed Vitals:   12/03/11 1519  BP: 125/87  Pulse: 92  Temp: 98 F (36.7 C)    PE: GENERAL:  Adult AA obese female. In no discomfort; no respiratory distress. PSYCH: Alert and appropriately interactive; Insight:Fair and Lacking   GAD 7 - Difficulty: very 1 2 3 4 5 6 7  Total:  3 2 2 3 2 3 3 18   PHQ-9 - Difficulty: somewhat 1 2 3 4 5 6 7 8 9  Total:  1 2 1 2 2  1 2 1 12  + ?3  MDQ - Extent of problem: moderate 1 2 3 4 5 6 7 8 9 10 11 12 13  Total:  Yes Yes No Yes Yes Yes No Yes Yes No No No No 7/13  1+ symptom @ same time:   Yes Family History:   Yes Personal History:  No H&N: AT/[place], trachea midline EENT:  MMM, no scleral icterus, EOMi HEART: RRR, S1/S2 heard, no murmur LUNGS: CTA B, no wheezes, no crackles Abdomen: +BS, soft mildly tender diffusely but worse in BLQ, no focal pain Pelvic: deferred EXTREMITIES: Moves all 4 extremities spontaneously, warm well perfused, no edema, bilateral DP and PT pulses 2/4.      Assessment & Plan:

## 2011-12-18 NOTE — Assessment & Plan Note (Signed)
Will defer to Dr. Debroah Loop.

## 2011-12-18 NOTE — Assessment & Plan Note (Addendum)
Add Celexa and Trazodone to help with sleep. Changes as above, consider BiPolar given marginal MDQ.  Discussed counseling but pt declined at this time >f/u 2 weeks for mood check with new meds

## 2011-12-18 NOTE — Assessment & Plan Note (Addendum)
Chronic lower pelvic pain.  Currently being investigated by Dr. Debroah Loop.  Will defer to Dr. Rosana Fret for further pain management he feels appropriate.  This does not appear to weren't chronic pain medicines however patient does report been completely incapacitated at this time without pain medicine and was given enough to make it to her next appointment with Dr. Debroah Loop. >  No additional pain medicine from [facility] for chronic pelvic pain.

## 2011-12-18 NOTE — Assessment & Plan Note (Signed)
Will try to help with pts sleep. Change Celexa to AM and Trazodone to PM

## 2011-12-20 ENCOUNTER — Ambulatory Visit: Payer: Self-pay | Admitting: Obstetrics & Gynecology

## 2011-12-25 ENCOUNTER — Other Ambulatory Visit: Payer: Self-pay | Admitting: Obstetrics & Gynecology

## 2011-12-25 ENCOUNTER — Telehealth: Payer: Self-pay | Admitting: *Deleted

## 2011-12-25 DIAGNOSIS — G8929 Other chronic pain: Secondary | ICD-10-CM

## 2011-12-25 MED ORDER — HYDROCODONE-ACETAMINOPHEN 5-500 MG PO TABS: 1.0000 | ORAL_TABLET | Freq: Four times a day (QID) | ORAL | Status: DC | PRN

## 2011-12-25 NOTE — Telephone Encounter (Signed)
Called and spoke with Dr. Debroah Loop. He stated that he would prefer an in basket msg so he can review the patient's chart as he is busy at this time. I will send the in basket msg for him to review and follow-up later today.

## 2011-12-25 NOTE — Telephone Encounter (Signed)
Called pt and informed her that Dr. Debroah Loop has authorized a refill of Vicodin and has been called in to her pharmacy.  Pt voiced understanding.

## 2011-12-25 NOTE — Telephone Encounter (Signed)
Pt left message requesting Rx refill of Oxycodone.  *Note: pt was given #6 tabs oxycodone by Dr. Berline Chough on 11/4 to last until visit w/Dr. Debroah Loop on 11/7 @ Gyn clinic. On 11/7 Dr. Debroah Loop prescribed #30 Vicodin.

## 2012-01-11 ENCOUNTER — Telehealth: Payer: Self-pay | Admitting: *Deleted

## 2012-01-11 DIAGNOSIS — G8929 Other chronic pain: Secondary | ICD-10-CM

## 2012-01-11 MED ORDER — HYDROCODONE-ACETAMINOPHEN 5-500 MG PO TABS: 1.0000 | ORAL_TABLET | Freq: Four times a day (QID) | ORAL | Status: DC | PRN

## 2012-01-11 NOTE — Telephone Encounter (Signed)
Pt left message stating that she is in "excruciating pain and I need a refill on my hydrocodone. Please call back."

## 2012-01-11 NOTE — Telephone Encounter (Signed)
Called pt after consult w/Dr. Jolayne Panther and informed her that her refill has been called in to [facility].  I asked pt about her missed appt on 11/21 and the financial aid papers that she needs to submit.  Pt stated that her father passed away and she has been very caught up with that situation. She agreed to reschedule her appt and will bring completed papers to the clinic next week. I stated that someone will call her next week to schedule follow up Gyn appt. Pt voiced understanding of all information and instructions.

## 2012-02-11 ENCOUNTER — Ambulatory Visit: Payer: Self-pay | Admitting: Obstetrics & Gynecology

## 2012-05-30 ENCOUNTER — Emergency Department (HOSPITAL_COMMUNITY): Payer: Self-pay

## 2012-05-30 ENCOUNTER — Emergency Department (HOSPITAL_COMMUNITY)
Admission: EM | Admit: 2012-05-30 | Discharge: 2012-05-30 | Disposition: A | Discharge status: Home / Self Care | Payer: Self-pay | Attending: Emergency Medicine | Admitting: Emergency Medicine

## 2012-05-30 ENCOUNTER — Encounter (HOSPITAL_COMMUNITY): Payer: Self-pay | Admitting: Emergency Medicine

## 2012-05-30 DIAGNOSIS — M25569 Pain in unspecified knee: Secondary | ICD-10-CM | POA: Insufficient documentation

## 2012-05-30 DIAGNOSIS — Z862 Personal history of diseases of the blood and blood-forming organs and certain disorders involving the immune mechanism: Secondary | ICD-10-CM | POA: Insufficient documentation

## 2012-05-30 DIAGNOSIS — M25559 Pain in unspecified hip: Secondary | ICD-10-CM | POA: Insufficient documentation

## 2012-05-30 DIAGNOSIS — M25552 Pain in left hip: Secondary | ICD-10-CM

## 2012-05-30 DIAGNOSIS — F17200 Nicotine dependence, unspecified, uncomplicated: Secondary | ICD-10-CM | POA: Insufficient documentation

## 2012-05-30 DIAGNOSIS — R52 Pain, unspecified: Secondary | ICD-10-CM | POA: Insufficient documentation

## 2012-05-30 DIAGNOSIS — R209 Unspecified disturbances of skin sensation: Secondary | ICD-10-CM | POA: Insufficient documentation

## 2012-05-30 DIAGNOSIS — F411 Generalized anxiety disorder: Secondary | ICD-10-CM | POA: Insufficient documentation

## 2012-05-30 DIAGNOSIS — Z79899 Other long term (current) drug therapy: Secondary | ICD-10-CM | POA: Insufficient documentation

## 2012-05-30 DIAGNOSIS — Z8742 Personal history of other diseases of the female genital tract: Secondary | ICD-10-CM | POA: Insufficient documentation

## 2012-05-30 MED ORDER — OXYCODONE-ACETAMINOPHEN 5-325 MG PO TABS
2.0000 | ORAL_TABLET | Freq: Once | ORAL | Status: AC
  Administered 2012-05-30: 2 via ORAL
  Filled 2012-05-30: qty 2

## 2012-05-30 MED ORDER — OXYCODONE-ACETAMINOPHEN 5-325 MG PO TABS: 2.0000 | ORAL_TABLET | ORAL | Status: DC | PRN

## 2012-05-30 MED ORDER — METHOCARBAMOL 500 MG PO TABS: 500.0000 mg | ORAL_TABLET | Freq: Two times a day (BID) | ORAL | Status: DC

## 2012-05-30 MED ORDER — METHOCARBAMOL 500 MG PO TABS
500.0000 mg | ORAL_TABLET | Freq: Once | ORAL | Status: AC
  Administered 2012-05-30: 500 mg via ORAL
  Filled 2012-05-30: qty 1

## 2012-05-30 MED ORDER — PROMETHAZINE HCL 25 MG PO TABS: 25.0000 mg | ORAL_TABLET | Freq: Four times a day (QID) | ORAL | Status: DC | PRN

## 2012-05-30 MED ORDER — ONDANSETRON 8 MG PO TBDP
8.0000 mg | ORAL_TABLET | Freq: Once | ORAL | Status: AC
  Administered 2012-05-30: 8 mg via ORAL
  Filled 2012-05-30: qty 1

## 2012-05-30 NOTE — ED Notes (Signed)
Patient transported to X-ray 

## 2012-05-30 NOTE — ED Notes (Signed)
Pt reports soreness and pain in l/thigh and knee x 1 week. Increased pain over last two days. Tx with Percocet "decreased pain with pills" Pt has difficulty bearing wt. On l/foot due to pain. Reports activtity of working in garden one week ago. Denies trama

## 2012-05-30 NOTE — ED Provider Notes (Signed)
History     CSN: [id number]  Arrival date & time 05/30/12  1223   First MD Initiated Contact with Patient 05/30/12 1235      Chief Complaint  Patient presents with  . Hip Pain    1 week hx of l/ hip and knee pain  . Knee Pain  . Numbness    pt reports numbness from l/thigh to l/knee    (Consider location/radiation/quality/duration/timing/severity/associated sxs/prior treatment) HPI Comments: Patient is a 52 year old female who presents today for complaints of left lateral hip pain with radiation into her left upper leg as well as her toes. It has been gradually worsening over the past week. She's been doing a lot of gardening and yard work and this past week. She states symptoms began they felt as though her muscles were sore and hot bath cure her aches. Now she states the pain is excruciating, radiating from the hip to toes. Her leg he feels like pins and needles. It radiates down the anterior aspect of her left upper leg it and down the lateral side of her lower leg.   Patient is a 52 y.o. female presenting with hip pain and knee pain. The history is provided by the patient. No language interpreter was used.  Hip Pain This is a new problem. The current episode started in the past 7 days. The problem occurs constantly. The problem has been gradually worsening. Associated symptoms include numbness. Pertinent negatives include no abdominal pain, chills, fever, nausea or weakness. Exacerbated by: any type of movement. She has tried oral narcotics for the symptoms. The treatment provided moderate relief.  Knee Pain Associated symptoms: no fever     Past Medical History  Diagnosis Date  . Headache   . Anemia     as teenager  . Fibroid   . Anxiety     Past Surgical History  Procedure Laterality Date  . Abdominal surgery    . Appendectomy      Family History  Problem Relation Age of Onset  . Breast cancer Sister     60s    History  Substance Use Topics  . Smoking status:  Current Every Day Smoker -- 1.00 packs/day    Types: Cigarettes    Start date: 08/21/1980  . Smokeless tobacco: Not on file  . Alcohol Use: No    OB History   Grav Para Term Preterm Abortions TAB SAB Ect Mult Living   3 3 3  0 0 0 0 0 0 2      Review of Systems  Constitutional: Negative for fever and chills.  Gastrointestinal: Negative for nausea and abdominal pain.  Neurological: Positive for numbness. Negative for weakness.    Allergies  Review of patient's allergies indicates no known allergies.  Home Medications   Current Outpatient Rx  Name  Route  Sig  Dispense  Refill  . amitriptyline (ELAVIL) 25 MG tablet   Oral   Take 25 mg by mouth at bedtime.         . citalopram (CELEXA) 20 MG tablet   Oral   Take 1 tablet (20 mg total) by mouth daily.   30 tablet   3   . diclofenac (VOLTAREN) 75 MG EC tablet   Oral   Take 1 tablet (75 mg total) by mouth 2 (two) times daily with a meal.   60 tablet   2   . oxyCODONE-acetaminophen (PERCOCET) 5-325 MG per tablet      1 tablet by mouth every 6  hours as needed for severe pain   6 tablet   0   . traZODone (DESYREL) 50 MG tablet   Oral   Take 0.5-1 tablets (25-50 mg total) by mouth at bedtime.   30 tablet   3     BP 149/102  Pulse 86  Temp(Src) 98.6 F (37 C)  Resp 20  SpO2 99%  LMP 05/26/2012  Physical Exam  Nursing note and vitals reviewed. Constitutional: She is oriented to person, place, and time. She appears well-developed and well-nourished. No distress.  HENT:  Head: Normocephalic and atraumatic.  Right Ear: External ear normal.  Left Ear: External ear normal.  Nose: Nose normal.  Mouth/Throat: Oropharynx is clear and moist.  Eyes: Conjunctivae are normal.  Neck: Normal range of motion.  Cardiovascular: Normal rate, regular rhythm and normal heart sounds.   Pulmonary/Chest: Effort normal and breath sounds normal. No stridor. No respiratory distress. She has no wheezes. She has no rales.   Abdominal: Soft. She exhibits no distension.  Musculoskeletal: Normal range of motion.       Cervical back: She exhibits no tenderness.       Thoracic back: She exhibits no tenderness and no bony tenderness.       Lumbar back: She exhibits no tenderness and no bony tenderness.  No step-off, deformity Hip log roll test negative Tender to palpation over left lateral hip Sensation intact diffusely throughout leg Neurovascularly intact Compartments soft   Neurological: She is alert and oriented to person, place, and time. She has normal strength. She displays no atrophy. No sensory deficit. She exhibits normal muscle tone.  Reflex Scores:      Patellar reflexes are 2+ on the right side and 2+ on the left side. Skin: Skin is warm and dry. She is not diaphoretic. No erythema.  Psychiatric: She has a normal mood and affect. Her behavior is normal.    ED Course  Procedures (including critical care time)  Labs Reviewed - No data to display Dg Hip Complete Left  05/30/2012  *RADIOLOGY REPORT*  Clinical Data: Hip pain, no known injury  LEFT HIP - COMPLETE 2+ VIEW  Comparison: None.  Findings: Three views of the left hip submitted.  No acute fracture or subluxation.  Minimal superior acetabular spurring.  Pelvic phleboliths are noted.  IMPRESSION: No acute fracture or subluxation.  Minimal superior acetabular spurring.   Original Report Authenticated By: Natasha Mead, M.D.      1. Hip pain, acute, left       MDM  Patient is a 52 year old female who presents today with one week of worsening left hip pain. She has been working in her garden this past week. X-ray negative for acute pathology. Discussed that I believe this is a Trochanteric bursitis. Continue to rest, ice, use pain medication, stretch. He to her hip before you stretch. She is neurovascularly intact. No red flags. Followup with primary care doctor next week. Strict return instructions given. Vital signs stable for discharge. Patient /  Family / Caregiver informed of clinical course, understand medical decision-making process, and agree with plan.         Mora Bellman, PA-C 05/30/12 1347

## 2012-05-30 NOTE — ED Provider Notes (Signed)
Medical screening examination/treatment/procedure(s) were performed by non-physician practitioner and as supervising physician I was immediately available for consultation/collaboration.    Kissa Campoy R Vaidehi Braddy, MD 05/30/12 1642 

## 2012-06-05 ENCOUNTER — Telehealth: Payer: Self-pay | Admitting: Sports Medicine

## 2012-06-05 NOTE — Telephone Encounter (Signed)
Pts has been lost to follow up for both our practice and OB GYN.  She was seen recently in ED however earlier this month.  Also records have been held since initial office visit.  Pt needs to either come by to pick them up soon or schedule a follow up appointment and get them at that time or give verbal okay to destroy as all pertinent information has been copied/scanned into [facility].  I would like to see the patient if she is willing to come back to discuss her ongoing medical care.    Andrena Mews, DO [facility] Resident - PGY-2 06/05/2012 7:52 PM

## 2012-07-07 ENCOUNTER — Encounter: Payer: Self-pay | Admitting: Family Medicine

## 2012-07-07 ENCOUNTER — Ambulatory Visit (INDEPENDENT_AMBULATORY_CARE_PROVIDER_SITE_OTHER): Payer: Self-pay | Admitting: Family Medicine

## 2012-07-07 VITALS — BP 127/91 | HR 99 | Ht 62.0 in | Wt 225.0 lb

## 2012-07-07 DIAGNOSIS — M79609 Pain in unspecified limb: Secondary | ICD-10-CM

## 2012-07-07 DIAGNOSIS — M79605 Pain in left leg: Secondary | ICD-10-CM

## 2012-07-07 MED ORDER — TRAMADOL HCL 50 MG PO TABS: 50.0000 mg | ORAL_TABLET | Freq: Three times a day (TID) | ORAL | Status: DC | PRN

## 2012-07-07 NOTE — Progress Notes (Signed)
S: Pt comes in today for work in appt for left hip pain.  Has been having pain for the past month in her left hip.  Was seen in the ER early May.  Has been using cold and warm compresses on her hip.  Feels like hip sometimes gets swollen, which is improved with the compresses.  Having shooting pain down the outside of her thigh.  Feels like she can't tolerate light touch, but tolerates massage.  Pain is persistent, stabbing like needles and itching/throbbing.  Pain initially started after gardening.  Pain has moved from her hip to her thigh now; pain isn't really in her hip anymore, is now in her thigh/hamstring.    Has been taking Aleve, which doesn't help the pain.  Was taking robaxin and percocet, which were Rx'ed by the ER.   ROS: Per HPI  History  Smoking status  . Current Every Day Smoker -- 1.00 packs/day  . Types: Cigarettes  . Start date: 08/21/1980  Smokeless tobacco  . Not on file    O:  Filed Vitals:   07/07/12 1036  BP: 127/91  Pulse: 99    Gen: NAD L hip: full ROM, limited exam due to body habitus and pain but hip flexor 5/5 bilaterally and sensation intact throughout; tender to palpation throughout entire thigh, no TTP over hip itself, ?bursa TTP; no edema or swelling, no redness or bruising throughout left upper leg. Able to ambulate but with a limp    A/P: 52 y.o. female p/w left thigh/hip pain -See problem list -f/u in 3-4 weeks with PCP

## 2012-07-07 NOTE — Assessment & Plan Note (Signed)
Unclear etiology, vague complaint.  Normal hip xray 1 month ago, could consider back vs knee films to rule out referred pain.  Ultram Rx'ed. Will have pt keep track of which activities worsen pain (laying vs standing, stairs, etc) to help differentiate potential pain sources.  May be pulled muscle but would have expected resolution/improvement by now.

## 2012-07-07 NOTE — Patient Instructions (Signed)
It was nice to meet you today.  I am giving you some pain medicine to see if it helps.  Plan to see Dr. Berline Chough, your PCP, in the next few weeks to follow up about your hip.

## 2012-07-08 ENCOUNTER — Telehealth: Payer: Self-pay | Admitting: Sports Medicine

## 2012-07-08 NOTE — Telephone Encounter (Signed)
Will fwd to MD who saw patient yesterday.  Lillianne Eick, Darlyne Russian, CMA

## 2012-07-08 NOTE — Telephone Encounter (Signed)
Patient is calling back because she hasn't heard anything back after calling this morning.  Her pain is excrutiating and the Tramadol is not helping at all.  I transferred her to [facility] for any suggestions on how else she may relieve pain until Dr. Fara Boros responds.

## 2012-07-08 NOTE — Telephone Encounter (Signed)
Patient was in yesterday and saw Dr. Fara Boros for Hip pain.  She was prescribed Tramadol for her pain, but it is not helping at all.  When she went to the hospital a month ago, she was given Percocet and that did help so she would like to go back to that.  She has not been able to rest at all.

## 2012-07-08 NOTE — Telephone Encounter (Signed)
Pt reports that Tramadol is not working and the only thing that helps is Percocet that they gave her in the ED. Advised pt to try motrin or Aleve - that doesn't help per pt. Also encouraged pt to try moist heat or ice - that don't help either per pt. Advised that we would return call if there were further instructions. Wyatt Haste, RN-BSN

## 2012-07-08 NOTE — Telephone Encounter (Signed)
I will not Rx narcotics.  Patient will need to be seen by her PCP to discuss narcotic Rx.  She can take 2 tramadol if she would like.  As an FYI to stayff, There is a note on her chart that [facility] does not Rx narcotics for this pt.

## 2012-07-09 NOTE — Telephone Encounter (Signed)
Left message for pt instructing that she may take Tramadol x 2 if needed and if pain is no better than she needs to follow up with personal PCP. Wyatt Haste, RN-BSN

## 2012-07-09 NOTE — Telephone Encounter (Signed)
Agree with not providing narcotics at this time (not inappropriate to provide this pt with Rx however if acute somatic/nociceptive pain complaint would consider).  However at this time with vague non focal findings I agree with conservative, non-opioid tx.  I would be more than happy to see this pt in my clinic but will not be providing opioids unless objective evidence of acute structural reason for pain.

## 2012-07-10 ENCOUNTER — Ambulatory Visit: Payer: Self-pay | Admitting: Family Medicine

## 2012-07-21 ENCOUNTER — Telehealth: Payer: Self-pay | Admitting: Sports Medicine

## 2012-07-21 DIAGNOSIS — M79605 Pain in left leg: Secondary | ICD-10-CM

## 2012-07-21 NOTE — Telephone Encounter (Signed)
Patient is calling asking if Dr. Phoebe Sharps would give her more Tramadol until she gets in to see Dr. Berline Chough.  The soonest she can see him is 7/1, which is the soonest opening he has.  She would like the nurse to call her with the answer.

## 2012-07-21 NOTE — Telephone Encounter (Signed)
Will fwd to Dr. Fara Boros for advice.   Paublo Warshawsky, Darlyne Russian, CMA

## 2012-07-21 NOTE — Telephone Encounter (Signed)
She will need to get pain medications filled through PCP, which is Dr. Berline Chough. Thanks!

## 2012-07-22 MED ORDER — TRAMADOL HCL 50 MG PO TABS: 50.0000 mg | ORAL_TABLET | Freq: Three times a day (TID) | ORAL | Status: DC | PRN

## 2012-07-22 NOTE — Telephone Encounter (Signed)
informed and pt notified.  Ziare Cryder, Darlyne Russian, CMA

## 2012-07-22 NOTE — Telephone Encounter (Signed)
Given refill but still needs to keep appointment.  please call and inform

## 2012-07-22 NOTE — Telephone Encounter (Signed)
Will fwd to PCP.  Tag Wurtz L, CMA  

## 2012-07-29 ENCOUNTER — Ambulatory Visit: Payer: Self-pay | Admitting: Sports Medicine

## 2013-05-03 ENCOUNTER — Encounter (HOSPITAL_COMMUNITY): Payer: Self-pay | Admitting: Emergency Medicine

## 2013-05-03 ENCOUNTER — Emergency Department (HOSPITAL_COMMUNITY)
Admission: EM | Admit: 2013-05-03 | Discharge: 2013-05-03 | Disposition: A | Discharge status: Home / Self Care | Payer: Self-pay | Attending: Emergency Medicine | Admitting: Emergency Medicine

## 2013-05-03 DIAGNOSIS — Z8659 Personal history of other mental and behavioral disorders: Secondary | ICD-10-CM | POA: Insufficient documentation

## 2013-05-03 DIAGNOSIS — F17200 Nicotine dependence, unspecified, uncomplicated: Secondary | ICD-10-CM | POA: Insufficient documentation

## 2013-05-03 DIAGNOSIS — G8929 Other chronic pain: Secondary | ICD-10-CM | POA: Insufficient documentation

## 2013-05-03 DIAGNOSIS — M545 Low back pain, unspecified: Secondary | ICD-10-CM | POA: Insufficient documentation

## 2013-05-03 DIAGNOSIS — Z862 Personal history of diseases of the blood and blood-forming organs and certain disorders involving the immune mechanism: Secondary | ICD-10-CM | POA: Insufficient documentation

## 2013-05-03 DIAGNOSIS — Z8742 Personal history of other diseases of the female genital tract: Secondary | ICD-10-CM | POA: Insufficient documentation

## 2013-05-03 DIAGNOSIS — M549 Dorsalgia, unspecified: Secondary | ICD-10-CM

## 2013-05-03 MED ORDER — OXYCODONE-ACETAMINOPHEN 5-325 MG PO TABS
2.0000 | ORAL_TABLET | Freq: Once | ORAL | Status: AC
  Administered 2013-05-03: 2 via ORAL
  Filled 2013-05-03: qty 2

## 2013-05-03 MED ORDER — OXYCODONE-ACETAMINOPHEN 10-325 MG PO TABS: 1.0000 | ORAL_TABLET | ORAL | Status: DC | PRN

## 2013-05-03 NOTE — ED Notes (Signed)
The pt has had lower back pain and hip pain for one year.  No known injury

## 2013-05-03 NOTE — ED Provider Notes (Signed)
CSN: [id number]     Arrival date & time 05/03/13  1534 History  This chart was scribed for non-physician practitioner, Charlann Lange, PA-C working with Arbie Cookey, MD by Frederich Balding, ED scribe. This patient was seen in room TR11C/TR11C and the patient's care was started at 4:05 PM.   Chief Complaint  Patient presents with  . Back Pain   The history is provided by the patient. No language interpreter was used.   HPI Comments: Debra Everett is a 53 y.o. female who presents to the Emergency Department complaining of chronic lower back pain that radiates into her left hip and leg. She states this has been ongoing for one year. Pt had an original injury where "something snapped" in her back. She was recently set up with pain management but her first appointment is not until the end of the month. Pt states she only takes Percocet with relief of pain. Certain movements and bearing weight worsen the pain.   Past Medical History  Diagnosis Date  . Headache(784.0)   . Anemia     as teenager  . Fibroid   . Anxiety    Past Surgical History  Procedure Laterality Date  . Abdominal surgery    . Appendectomy     Family History  Problem Relation Age of Onset  . Breast cancer Sister     48s   History  Substance Use Topics  . Smoking status: Current Every Day Smoker -- 1.00 packs/day    Types: Cigarettes    Start date: 08/21/1980  . Smokeless tobacco: Not on file  . Alcohol Use: No   OB History   Grav Para Term Preterm Abortions TAB SAB Ect Mult Living   3 3 3  0 0 0 0 0 0 2     Review of Systems  Musculoskeletal: Positive for back pain.  All other systems reviewed and are negative.   Allergies  Review of patient's allergies indicates no known allergies.  Home Medications  No current outpatient prescriptions on file.  BP 122/83  Pulse 102  Temp(Src) 97.5 F (36.4 C) (Oral)  Resp 18  Ht 5\' 4"  (1.626 m)  SpO2 96%  Physical Exam  Nursing note and vitals  reviewed. Constitutional: She is oriented to person, place, and time. She appears well-developed and well-nourished. No distress.  HENT:  Head: Normocephalic and atraumatic.  Eyes: EOM are normal.  Neck: Neck supple. No tracheal deviation present.  Cardiovascular: Normal rate.   Pulmonary/Chest: Effort normal. No respiratory distress.  Musculoskeletal: Normal range of motion.  Tenderness to midline lumbar and paraspinal muscles. Normal and equal grip strength. Normal and equal reflexes.   Neurological: She is alert and oriented to person, place, and time.  Skin: Skin is warm and dry.  Psychiatric: She has a normal mood and affect. Her behavior is normal.    ED Course  Procedures (including critical care time)  DIAGNOSTIC STUDIES: Oxygen Saturation is 96% on RA, normal by my interpretation.    COORDINATION OF CARE: 4:08 PM-Discussed treatment plan which includes a short course of Percocet with pt at bedside and pt agreed to plan.   Labs Review Labs Reviewed - No data to display Imaging Review No results found.   EKG Interpretation None      MDM   Final diagnoses:  None    1. Chronic back pain  No neurologic red flags. Patient with symptomatic chronic pain in back without change in presentation. Stable for discharge.  I personally performed the services described in this documentation, which was scribed in my presence. The recorded information has been reviewed and is accurate.  Dewaine Oats, PA-C 05/03/13 2239

## 2013-05-03 NOTE — Discharge Instructions (Signed)
Back Pain, Adult Low back pain is very common. About 1 in 5 people have back pain.The cause of low back pain is rarely dangerous. The pain often gets better over time.About half of people with a sudden onset of back pain feel better in just 2 weeks. About 8 in 10 people feel better by 6 weeks.  CAUSES Some common causes of back pain include:  Strain of the muscles or ligaments supporting the spine.  Wear and tear (degeneration) of the spinal discs.  Arthritis.  Direct injury to the back. DIAGNOSIS Most of the time, the direct cause of low back pain is not known.However, back pain can be treated effectively even when the exact cause of the pain is unknown.Answering your caregiver's questions about your overall health and symptoms is one of the most accurate ways to make sure the cause of your pain is not dangerous. If your caregiver needs more information, he or she may order lab work or imaging tests (X-rays or MRIs).However, even if imaging tests show changes in your back, this usually does not require surgery. HOME CARE INSTRUCTIONS For many people, back pain returns.Since low back pain is rarely dangerous, it is often a condition that people can learn to manageon their own.   Remain active. It is stressful on the back to sit or stand in one place. Do not sit, drive, or stand in one place for more than 30 minutes at a time. Take short walks on level surfaces as soon as pain allows.Try to increase the length of time you walk each day.  Do not stay in bed.Resting more than 1 or 2 days can delay your recovery.  Do not avoid exercise or work.Your body is made to move.It is not dangerous to be active, even though your back may hurt.Your back will likely heal faster if you return to being active before your pain is gone.  Pay attention to your body when you bend and lift. Many people have less discomfortwhen lifting if they bend their knees, keep the load close to their bodies,and  avoid twisting. Often, the most comfortable positions are those that put less stress on your recovering back.  Find a comfortable position to sleep. Use a firm mattress and lie on your side with your knees slightly bent. If you lie on your back, put a pillow under your knees.  Only take over-the-counter or prescription medicines as directed by your caregiver. Over-the-counter medicines to reduce pain and inflammation are often the most helpful.Your caregiver may prescribe muscle relaxant drugs.These medicines help dull your pain so you can more quickly return to your normal activities and healthy exercise.  Put ice on the injured area.  Put ice in a plastic bag.  Place a towel between your skin and the bag.  Leave the ice on for 15-20 minutes, 03-04 times a day for the first 2 to 3 days. After that, ice and heat may be alternated to reduce pain and spasms.  Ask your caregiver about trying back exercises and gentle massage. This may be of some benefit.  Avoid feeling anxious or stressed.Stress increases muscle tension and can worsen back pain.It is important to recognize when you are anxious or stressed and learn ways to manage it.Exercise is a great option. SEEK MEDICAL CARE IF:  You have pain that is not relieved with rest or medicine.  You have pain that does not improve in 1 week.  You have new symptoms.  You are generally not feeling well. SEEK   IMMEDIATE MEDICAL CARE IF:   You have pain that radiates from your back into your legs.  You develop new bowel or bladder control problems.  You have unusual weakness or numbness in your arms or legs.  You develop nausea or vomiting.  You develop abdominal pain.  You feel faint. Document Released: 01/15/2005 Document Revised: 07/17/2011 Document Reviewed: 06/05/2010 ExitCare Patient Information 2014 [employer].  

## 2013-05-05 NOTE — ED Provider Notes (Signed)
Medical screening examination/treatment/procedure(s) were performed by non-physician practitioner and as supervising physician I was immediately available for consultation/collaboration.   Houston Siren III, MD 05/05/13 [phone]

## 2013-11-30 ENCOUNTER — Encounter (HOSPITAL_COMMUNITY): Payer: Self-pay | Admitting: Emergency Medicine

## 2014-01-06 ENCOUNTER — Ambulatory Visit (INDEPENDENT_AMBULATORY_CARE_PROVIDER_SITE_OTHER): Payer: Self-pay | Admitting: Family Medicine

## 2014-01-06 ENCOUNTER — Encounter: Payer: Self-pay | Admitting: Family Medicine

## 2014-01-06 VITALS — BP 126/83 | HR 88 | Temp 97.8°F | Wt 228.0 lb

## 2014-01-06 DIAGNOSIS — G8929 Other chronic pain: Secondary | ICD-10-CM

## 2014-01-06 DIAGNOSIS — M2550 Pain in unspecified joint: Secondary | ICD-10-CM | POA: Insufficient documentation

## 2014-01-06 LAB — CBC WITH DIFFERENTIAL/PLATELET
Basophils Absolute: 0.1 10*3/uL (ref 0.0–0.1)
Basophils Relative: 1 % (ref 0–1)
Eosinophils Absolute: 0.2 10*3/uL (ref 0.0–0.7)
Eosinophils Relative: 4 % (ref 0–5)
HCT: 37.6 % (ref 36.0–46.0)
Hemoglobin: 13.3 g/dL (ref 12.0–15.0)
Lymphocytes Relative: 35 % (ref 12–46)
Lymphs Abs: 1.9 10*3/uL (ref 0.7–4.0)
MCH: 31.1 pg (ref 26.0–34.0)
MCHC: 35.4 g/dL (ref 30.0–36.0)
MCV: 88.1 fL (ref 78.0–100.0)
MPV: 9.2 fL — ABNORMAL LOW (ref 9.4–12.4)
Monocytes Absolute: 0.5 10*3/uL (ref 0.1–1.0)
Monocytes Relative: 9 % (ref 3–12)
Neutro Abs: 2.7 10*3/uL (ref 1.7–7.7)
Neutrophils Relative %: 51 % (ref 43–77)
Platelets: 388 10*3/uL (ref 150–400)
RBC: 4.27 MIL/uL (ref 3.87–5.11)
RDW: 13.9 % (ref 11.5–15.5)
WBC: 5.3 10*3/uL (ref 4.0–10.5)

## 2014-01-06 LAB — C-REACTIVE PROTEIN: CRP: 1.6 mg/dL — ABNORMAL HIGH (ref <0.60)

## 2014-01-06 LAB — RHEUMATOID FACTOR: Rhuematoid fact SerPl-aCnc: 447 IU/mL — ABNORMAL HIGH (ref <=14)

## 2014-01-06 LAB — COMPREHENSIVE METABOLIC PANEL
ALT: 35 U/L (ref 0–35)
AST: 29 U/L (ref 0–37)
Albumin: 3.8 g/dL (ref 3.5–5.2)
Alkaline Phosphatase: 97 U/L (ref 39–117)
BUN: 13 mg/dL (ref 6–23)
CO2: 22 mEq/L (ref 19–32)
Calcium: 10.1 mg/dL (ref 8.4–10.5)
Chloride: 105 mEq/L (ref 96–112)
Creat: 0.6 mg/dL (ref 0.50–1.10)
Glucose, Bld: 124 mg/dL — ABNORMAL HIGH (ref 70–99)
Potassium: 4.1 mEq/L (ref 3.5–5.3)
Sodium: 138 mEq/L (ref 135–145)
Total Bilirubin: 0.7 mg/dL (ref 0.2–1.2)
Total Protein: 8.6 g/dL — ABNORMAL HIGH (ref 6.0–8.3)

## 2014-01-06 LAB — TSH: TSH: 1.901 u[IU]/mL (ref 0.350–4.500)

## 2014-01-06 LAB — POCT SEDIMENTATION RATE: POCT SED RATE: 98 mm/hr — AB (ref 0–22)

## 2014-01-06 LAB — URIC ACID: Uric Acid, Serum: 7.2 mg/dL — ABNORMAL HIGH (ref 2.4–7.0)

## 2014-01-06 MED ORDER — DICLOFENAC SODIUM 75 MG PO TBEC: 75.0000 mg | DELAYED_RELEASE_TABLET | Freq: Two times a day (BID) | ORAL | Status: DC

## 2014-01-06 NOTE — Patient Instructions (Signed)
Arthritis, Nonspecific °Arthritis is inflammation of a joint. This usually means pain, redness, warmth or swelling are present. One or more joints may be involved. There are a number of types of arthritis. Your caregiver may not be able to tell what type of arthritis you have right away. °CAUSES  °The most common cause of arthritis is the wear and tear on the joint (osteoarthritis). This causes damage to the cartilage, which can break down over time. The knees, hips, back and neck are most often affected by this type of arthritis. °Other types of arthritis and common causes of joint pain include: °· Sprains and other injuries near the joint. Sometimes minor sprains and injuries cause pain and swelling that develop hours later. °· Rheumatoid arthritis. This affects hands, feet and knees. It usually affects both sides of your body at the same time. It is often associated with chronic ailments, fever, weight loss and general weakness. °· Crystal arthritis. Gout and pseudo gout can cause occasional acute severe pain, redness and swelling in the foot, ankle, or knee. °· Infectious arthritis. Bacteria can get into a joint through a break in overlying skin. This can cause infection of the joint. Bacteria and viruses can also spread through the blood and affect your joints. °· Drug, infectious and allergy reactions. Sometimes joints can become mildly painful and slightly swollen with these types of illnesses. °SYMPTOMS  °· Pain is the main symptom. °· Your joint or joints can also be red, swollen and warm or hot to the touch. °· You may have a fever with certain types of arthritis, or even feel overall ill. °· The joint with arthritis will hurt with movement. Stiffness is present with some types of arthritis. °DIAGNOSIS  °Your caregiver will suspect arthritis based on your description of your symptoms and on your exam. Testing may be needed to find the type of arthritis: °· Blood and sometimes urine tests. °· X-ray tests  and sometimes CT or MRI scans. °· Removal of fluid from the joint (arthrocentesis) is done to check for bacteria, crystals or other causes. Your caregiver (or a specialist) will numb the area over the joint with a local anesthetic, and use a needle to remove joint fluid for examination. This procedure is only minimally uncomfortable. °· Even with these tests, your caregiver may not be able to tell what kind of arthritis you have. Consultation with a specialist (rheumatologist) may be helpful. °TREATMENT  °Your caregiver will discuss with you treatment specific to your type of arthritis. If the specific type cannot be determined, then the following general recommendations may apply. °Treatment of severe joint pain includes: °· Rest. °· Elevation. °· Anti-inflammatory medication (for example, ibuprofen) may be prescribed. Avoiding activities that cause increased pain. °· Only take over-the-counter or prescription medicines for pain and discomfort as recommended by your caregiver. °· Cold packs over an inflamed joint may be used for 10 to 15 minutes every hour. Hot packs sometimes feel better, but do not use overnight. Do not use hot packs if you are diabetic without your caregiver's permission. °· A cortisone shot into arthritic joints may help reduce pain and swelling. °· Any acute arthritis that gets worse over the next 1 to 2 days needs to be looked at to be sure there is no joint infection. °Long-term arthritis treatment involves modifying activities and lifestyle to reduce joint stress jarring. This can include weight loss. Also, exercise is needed to nourish the joint cartilage and remove waste. This helps keep the muscles   around the joint strong. °HOME CARE INSTRUCTIONS  °· Do not take aspirin to relieve pain if gout is suspected. This elevates uric acid levels. °· Only take over-the-counter or prescription medicines for pain, discomfort or fever as directed by your caregiver. °· Rest the joint as much as  possible. °· If your joint is swollen, keep it elevated. °· Use crutches if the painful joint is in your leg. °· Drinking plenty of fluids may help for certain types of arthritis. °· Follow your caregiver's dietary instructions. °· Try low-impact exercise such as: °¨ Swimming. °¨ Water aerobics. °¨ Biking. °¨ Walking. °· Morning stiffness is often relieved by a warm shower. °· Put your joints through regular range-of-motion. °SEEK MEDICAL CARE IF:  °· You do not feel better in 24 hours or are getting worse. °· You have side effects to medications, or are not getting better with treatment. °SEEK IMMEDIATE MEDICAL CARE IF:  °· You have a fever. °· You develop severe joint pain, swelling or redness. °· Many joints are involved and become painful and swollen. °· There is severe back pain and/or leg weakness. °· You have loss of bowel or bladder control. °Document Released: 02/23/2004 Document Revised: 04/09/2011 Document Reviewed: 03/10/2008 °ExitCare® Patient Information ©2015 [employer]. This information is not intended to replace advice given to you by your health care provider. Make sure you discuss any questions you have with your health care provider. ° °

## 2014-01-06 NOTE — Assessment & Plan Note (Signed)
Uncertain etiology of patient's pain. Exam today was difficult to assess, considering everywhere that patient was touched was tender, she was not cooperative with neurological exam. She did walk into the clinic today without assistance, but asked for a wheelchair and used a wheelchair while present in the clinic. It appears she had some chronic pain with narcotic use, issues in the past, that was more pelvic pain in nature in 2013. She also has a history of depression and is not medicated. - I will start with a workup to look for any arthritic/inflammatory/autoimmune like panel. - Labs: TSH, CBC, BMP, ESR, CRP, ANA, rheumatoid factor, uric acid Discussed pain with patient, and decided on trying Voltaren oral medication. She is to follow-up with her PCP in 2-4 weeks.

## 2014-01-06 NOTE — Progress Notes (Signed)
   Subjective:    Patient ID: Debra Everett, female    DOB: [birth year], 53 y.o.   MRN: [id number]  HPI  Polyarthralgia: Patient presents to family medicine clinic for same day appointment for polyarthralgia, generalized pain. She denies any trauma, history of arthritis or gout. She describes her pain in multiple joints, and reports it's worse in the morning and in the middle of the night. When asked for location of pain she states it's in her right thumb, left hand and arm, left hip and left leg, bilateral knees. She states the left hip and leg pain has been occurring for years, but recently the left arm and hand has been given her trouble for the past 2 months. She states her husband has to help her get her clothes on daily, because she doesn't have range of motion in her right wrist and arm without pain. Per chart review she possibly could have a chronic pain issue with chronic narcotics in the past around 2013. She is currently not on any known medications. She has tried different types of balms, rubs, creams, BenGay, Aleve and ibuprofen for the pain without resolution. She has tried heat without resolution of her pain. She has no cutaneous skin changes or rashes. She denies any erythema of her joints, but admits to swelling of the joints. She denies any fevers.  Every day smoker  Past Medical History  Diagnosis Date  . Headache(784.0)   . Anemia     as teenager  . Fibroid   . Anxiety    No Known Allergies  Review of Systems Per history of present illness    Objective:   Physical Exam BP 126/83 mmHg  Pulse 88  Temp(Src) 97.8 F (36.6 C) (Oral)  Wt 228 lb (103.42 kg)  LMP 10/07/2013 Gen: African-American female, sitting in wheelchair, no acute distress, nontoxic appearance, well-developed, well-nourished, morbidly obese. HEENT: AT. [place].Bilateral eyes without injections or icterus. MMM.  Skin: No rashes, purpura or petechiae.  Neuro/msk: In wheelchair. PERLA. EOMi. Alert.  Oriented 3. Patient not cooperative with neuro exam. Bilateral upper and lower extremity decrease in muscle strength exam 2/5- 4/5. Unable to grip fingers bilaterally. DTRs equal bilaterally. No erythema of the affected joints, mild swelling of right thumb, and left PIP joint of third and fourth fingers. Tender to palpation diffusely all over body. Psych: Odd Behavior, normal speech.         Assessment & Plan:

## 2014-01-07 ENCOUNTER — Telehealth: Payer: Self-pay | Admitting: Family Medicine

## 2014-01-07 ENCOUNTER — Other Ambulatory Visit: Payer: Self-pay | Admitting: Family Medicine

## 2014-01-07 DIAGNOSIS — R768 Other specified abnormal immunological findings in serum: Secondary | ICD-10-CM

## 2014-01-07 LAB — ANTI-NUCLEAR AB-TITER (ANA TITER): ANA Titer 1: 1:40 {titer} — ABNORMAL HIGH

## 2014-01-07 LAB — ANA: Anti Nuclear Antibody(ANA): POSITIVE — AB

## 2014-01-07 MED ORDER — NAPROXEN 500 MG PO TABS: ORAL_TABLET | ORAL | Status: DC

## 2014-01-07 NOTE — Telephone Encounter (Signed)
I called patient this evening to discuss her positive results below.  Rheumatoid factor 447, sedimentation rate 98, CRP 1.6, ANA positive (speckled pattern, 1:40), uric acid 7.2 (slightly elevated), CBC normal, TSH 1.901 (normal), CMP normal with the exception of mildly elevated glucose and protein of 8.6. I have placed a rheumatology referral for her that will hopefully happen as urgently as possible. I have called in naproxen that I want her to take 1500 mg daily. I've instructed her to take this every day as prescribed until she makes it to her rheumatologist. I have discontinued her voltaren. Prednisone taper could be considered, but would like to wait on this to see how long it's going to take her to get into rheumatology. If needed she could do a taper with a low maintenance. She is to make a follow-up appointment with either myself or her PCP within 2 weeks to see how she's doing. Thanks.

## 2014-01-07 NOTE — Assessment & Plan Note (Signed)
Rheumatoid factor 447, sedimentation rate 98, CRP 1.6, ANA positive (speckled pattern, 1:40), uric acid 7.2, CBC normal, TSH 1.901, CMP normal with the exception of mildly elevated glucose and protein of 8.6. Patient started on NSAIDs, rheumatological referral placed

## 2014-01-11 ENCOUNTER — Telehealth: Payer: Self-pay | Admitting: Family Medicine

## 2014-01-11 NOTE — Telephone Encounter (Signed)
Patient will need to see me in office then.  I've not seen this patient personally and am unable to prescribe something for pain without having seen her.  Per Dr Lucita Lora note, she has labs concerning for autoimmune etiology and was referred to a rheumatologist for evaluation/management.

## 2014-01-11 NOTE — Telephone Encounter (Signed)
She needs another medication  diclofunac-dr-makes her sick and gives her headaches Naproxen-makes her sick Her pain is worse. Got to have a medication that will stop her pain

## 2014-01-13 ENCOUNTER — Telehealth: Payer: Self-pay | Admitting: *Deleted

## 2014-01-13 NOTE — Telephone Encounter (Signed)
Pt states that she call the Financial assistance people at [facility].  They told her that she will need to be referred by [place] to the specialist before they can help her.  States that she called [phone]. [name], [name]

## 2014-02-05 ENCOUNTER — Encounter: Payer: Self-pay | Admitting: Family Medicine

## 2014-02-05 ENCOUNTER — Ambulatory Visit (INDEPENDENT_AMBULATORY_CARE_PROVIDER_SITE_OTHER): Payer: Self-pay | Admitting: Family Medicine

## 2014-02-05 VITALS — BP 140/100 | HR 101 | Temp 98.1°F

## 2014-02-05 DIAGNOSIS — M2550 Pain in unspecified joint: Secondary | ICD-10-CM

## 2014-02-05 MED ORDER — KETOROLAC TROMETHAMINE 60 MG/2ML IM SOLN
60.0000 mg | Freq: Once | INTRAMUSCULAR | Status: AC
  Administered 2014-02-05: 60 mg via INTRAMUSCULAR

## 2014-02-05 MED ORDER — PREDNISONE 50 MG PO TABS: 50.0000 mg | ORAL_TABLET | Freq: Every day | ORAL | Status: DC

## 2014-02-05 NOTE — Addendum Note (Signed)
Addended by: Levert Feinstein F on: 02/05/2014 02:52 PM   Modules accepted: Orders

## 2014-02-05 NOTE — Assessment & Plan Note (Signed)
Patient Rhematoid factor positive.  Labs very concerning for RA.  Patient declines anti-[facility] lab draw today.  She would prefer labs done at [facility].  Patient in notable pain today.   -[facility] called.  Appt scheduled for 2/18 -Prednisone 50mg  Rxd in interim for pain.  Medication and side effects discussed with patient -Toradol 60 IM given today -Patient to call for appt for pain management if continued pain AFTER prednisone completion. -Patient to return 1 week after seeing [facility]. -Case discussed with Dr Lindell Noe

## 2014-02-05 NOTE — Patient Instructions (Addendum)
It was a pleasure seeing you today, [name]!  Information regarding what we discussed is included in this packet.  Please feel free to call our office if any questions or concerns arise.  Please make sure that you see the rheumatologist at [facility] as soon as possible.  You have an appointment scheduled with them in February.  I am prescribing a medication called Prednisone.  Please make sure to take this EXACTLY as directed.  Please schedule an appointment with me for follow up on your joint pain after you have seen them or sooner if symptoms worsen.  Also, you may ask to schedule an appointment with [name] up front for financial evaluation/ questions.    Debra Everett M. Lajuana Ripple, DO PGY-1, [facility]  Arthralgia Arthralgia is joint pain. A joint is a place where two bones meet. Joint pain can happen for many reasons. The joint can be bruised, stiff, infected, or weak from aging. Pain usually goes away after resting and taking medicine for soreness.  HOME CARE  Rest the joint as told by your doctor.  Keep the sore joint raised (elevated) for the first 24 hours.  Put ice on the joint area.  Put ice in a plastic bag.  Place a towel between your skin and the bag.  Leave the ice on for 15-20 minutes, 03-04 times a day.  Wear your splint, casting, elastic bandage, or sling as told by your doctor.  Only take medicine as told by your doctor. Do not take aspirin.  Use crutches as told by your doctor. Do not put weight on the joint until told to by your doctor. GET HELP RIGHT AWAY IF:   You have bruising, puffiness (swelling), or more pain.  Your fingers or toes turn blue or start to lose feeling (numb).  Your medicine does not lessen the pain.  Your pain becomes severe.  You have a temperature by mouth above 102 F (38.9 C), not controlled by medicine.  You cannot move or use the joint. MAKE SURE YOU:   Understand these instructions.  Will watch your  condition.  Will get help right away if you are not doing well or get worse. Document Released: 01/03/2009 Document Revised: 04/09/2011 Document Reviewed: 01/03/2009 [facility] Patient Information 2015 [place], [place]. This information is not intended to replace advice given to you by your health care provider. Make sure you discuss any questions you have with your health care provider.

## 2014-02-05 NOTE — Progress Notes (Signed)
Patient ID: Debra Everett, female   DOB: Jun 03, 1960, 54 y.o.   MRN: [id number]    Subjective: [id number] HPI: Patient is a 54 y.o. female presenting to clinic today for polyarthralgia. Concerns today include:  Polyarthralgia Patient seen last month for polyarthralgia.  Patient states that she has been taking Naproxen as directed with little relief.  She states that the medication upsets her stomach.  She hands me an empty bottle of Percocet and states that this is the only thing that helps her pain.  She reports that pain has been ongoing in her b/l hands and LE for over a year and that over the last couple of months it has increased in severity 10/10.  She has tried creams and compresses with no relief.  Pain is worse in am and late at night.  She states that pain is so bad in her hands sometimes that she cannot hold onto objects.  She also states that she cannot stand for long periods of time d/t pain.  In addition, dorsiflexion of her feet exacerbate pain.  She identifies pain in her joints mostly.  She states pain wakes her up at night.  Today she states pain is more severe in her LE.  Denies CP, SOB, urinary or bowel incontinence, sensation changes.    ROS: All other systems reviewed and are negative. FamHx: no known h/o autoimmune dz  Objective: Office vital signs reviewed. BP 140/100 mmHg  Pulse 101  Temp(Src) 98.1 F (36.7 C) (Oral)  Wt   LMP 10/07/2013  Physical Examination:  General: Awake, alert, well nourished female, doubled over whimpering in pain HEENT: Normal. Extremities: WWP, No cyanosis or clubbing; +2 pulses bilaterally  B/L hands: with slight lateral deviation, mild swelling appreciated in the MIP joints (particularly digit 2-3), no erythema or warmth appreciated, active ROM in tact, strength in tact.   LE: ROM decreased b/l.  Dorsiflexion extremely limited by pain.  Sensation in tact.  Patient exquisitely TTP at ankle joint. No erythema or notable  warmth appreciated. Palpable pulses b/l. No edematous changes appreciated.  No bony deformities.  MSK: antalgic gait and station, patient relying on wheelchair for mobility today. Skin: dry, intact, no rashes or lesions Neuro: Strength and sensation grossly intact, follows commands  Recent Results (from the past 2160 hour(s))  CBC with Differential     Status: Abnormal   Collection Time: 01/06/14  3:06 PM  Result Value Ref Range   WBC 5.3 4.0 - 10.5 K/uL   RBC 4.27 3.87 - 5.11 MIL/uL   Hemoglobin 13.3 12.0 - 15.0 g/dL   HCT 37.6 36.0 - 46.0 %   MCV 88.1 78.0 - 100.0 fL   MCH 31.1 26.0 - 34.0 pg   MCHC 35.4 30.0 - 36.0 g/dL   RDW 13.9 11.5 - 15.5 %   Platelets 388 150 - 400 K/uL   MPV 9.2 (L) 9.4 - 12.4 fL   Neutrophils Relative % 51 43 - 77 %   Neutro Abs 2.7 1.7 - 7.7 K/uL   Lymphocytes Relative 35 12 - 46 %   Lymphs Abs 1.9 0.7 - 4.0 K/uL   Monocytes Relative 9 3 - 12 %   Monocytes Absolute 0.5 0.1 - 1.0 K/uL   Eosinophils Relative 4 0 - 5 %   Eosinophils Absolute 0.2 0.0 - 0.7 K/uL   Basophils Relative 1 0 - 1 %   Basophils Absolute 0.1 0.0 - 0.1 K/uL   Smear Review Criteria for review not met  Comprehensive metabolic panel     Status: Abnormal   Collection Time: 01/06/14  3:06 PM  Result Value Ref Range   Sodium 138 135 - 145 mEq/L   Potassium 4.1 3.5 - 5.3 mEq/L   Chloride 105 96 - 112 mEq/L   CO2 22 19 - 32 mEq/L   Glucose, Bld 124 (H) 70 - 99 mg/dL   BUN 13 6 - 23 mg/dL   Creat 0.60 0.50 - 1.10 mg/dL   Total Bilirubin 0.7 0.2 - 1.2 mg/dL   Alkaline Phosphatase 97 39 - 117 U/L   AST 29 0 - 37 U/L   ALT 35 0 - 35 U/L   Total Protein 8.6 (H) 6.0 - 8.3 g/dL   Albumin 3.8 3.5 - 5.2 g/dL   Calcium 10.1 8.4 - 10.5 mg/dL  C-reactive protein     Status: Abnormal   Collection Time: 01/06/14  3:06 PM  Result Value Ref Range   CRP 1.6 (H) <0.60 mg/dL  Uric Acid     Status: Abnormal   Collection Time: 01/06/14  3:06 PM  Result Value Ref Range   Uric Acid, Serum 7.2  (H) 2.4 - 7.0 mg/dL  ANA     Status: Abnormal   Collection Time: 01/06/14  3:06 PM  Result Value Ref Range   ANA Ser Ql POS (A) NEGATIVE  Rheumatoid factor     Status: Abnormal   Collection Time: 01/06/14  3:06 PM  Result Value Ref Range   Rhuematoid fact SerPl-aCnc 447 (H) <=14 IU/mL    Comment: Result confirmed by automatic dilution. Result repeated and verified.                            Interpretive Table                     Low Positive: 15 - 41 IU/mL                     High Positive:  >= 42 IU/mL    In addition to the RF result, and clinical symptoms including joint  involvement, the 2010 ACR Classification Criteria for  scoring/diagnosing Rheumatoid Arthritis include the results of the  following tests:  CRP (35597), ESR (15010), and CCP (APCA) (41638).  www.rheumatology.org/practice/clinical/classification/ra/ra_2010.asp   Anti-nuclear ab-titer (ANA titer)     Status: Abnormal   Collection Time: 01/06/14  3:06 PM  Result Value Ref Range   ANA Titer 1 1:40 (H) <1:40      Comment:   Reference Ranges: 1:40 - 1:80 Weakly positive, usually not clinically significant. > or = to 1:160 Result may be clinically significant.                                                                           ANA Pattern 1 SPECKLED (A)   POCT SEDIMENTATION RATE     Status: Abnormal   Collection Time: 01/06/14  3:07 PM  Result Value Ref Range   POCT SED RATE 98 (A) 0 - 22 mm/hr  TSH     Status: None   Collection Time: 01/06/14  3:15 PM  Result Value Ref Range  TSH 1.901 0.350 - 4.500 uIU/mL   Assessment: 54 y.o. female with polyarthralgia highly suspicious for RA.  Plan: See Problem List and After Visit Summary   Total time 30 mins. >50% was spent with the patient in coordinating care and counseling regarding symptoms and condition.  Janora Norlander, DO PGY-1, [place]

## 2014-02-18 ENCOUNTER — Other Ambulatory Visit: Payer: Self-pay | Admitting: Family Medicine

## 2014-02-18 DIAGNOSIS — M2550 Pain in unspecified joint: Secondary | ICD-10-CM

## 2014-02-18 NOTE — Telephone Encounter (Signed)
As explained to patient at last office visit.  Prednisone intended to be a SHORT burst until she is seen by Rheumatology.  Please verify if patient has appt. Scheduled.  Thanks

## 2014-02-22 NOTE — Telephone Encounter (Signed)
Spoke with pt and informed her of below and she does have a Rheumatology appt scheduled. Katharina Caper, Jo Cerone D

## 2014-03-08 NOTE — Progress Notes (Signed)
Reviewed resident assessment and plan.  JB

## 2014-03-24 ENCOUNTER — Ambulatory Visit: Payer: Self-pay | Admitting: Family Medicine

## 2014-04-09 ENCOUNTER — Encounter: Payer: Self-pay | Admitting: Family Medicine

## 2014-04-09 ENCOUNTER — Other Ambulatory Visit: Payer: Self-pay | Admitting: Family Medicine

## 2014-04-09 ENCOUNTER — Telehealth: Payer: Self-pay | Admitting: Family Medicine

## 2014-04-09 DIAGNOSIS — M2550 Pain in unspecified joint: Secondary | ICD-10-CM

## 2014-04-09 DIAGNOSIS — G8929 Other chronic pain: Secondary | ICD-10-CM | POA: Insufficient documentation

## 2014-04-09 MED ORDER — OXYCODONE-ACETAMINOPHEN 5-325 MG PO TABS: 1.0000 | ORAL_TABLET | ORAL | Status: DC | PRN

## 2014-04-09 NOTE — Progress Notes (Signed)
Attempted to call stright to VM.  LVM to call office back.  I personally, have never prescribed Oxycodone to patient and would like details as to why she is needing this medication vs Voltaren, which she was most recently prescribed.  What is the new medication she is waiting for medicaid to pay for? Also, has she finished the prednisone taper? Thanks.

## 2014-04-09 NOTE — Telephone Encounter (Signed)
Patient seen at Pain Management (after being referred by rheumatology) and was told she would have to pay $600 since her medicaid is not approved yet.  Applied for Medicaid a month ago.  Expects that it won't come through for another couple of months.  Explained to her that I will give her an Rx for Percocet this one time, as she has an appointment with me later this month.  However, we discussed that any future needs for controlled medications will require an office visit BEFORE obtaining the medication.  She voiced good understanding of this.  Will RF Oxycodone x1 and place up front for pickup.

## 2014-04-09 NOTE — Progress Notes (Signed)
Pt came in and expressed concern that her medicaid is in process and currently needs her oxycodone refilled until the medicaid comes through to take care of the medication that the rheumatologist needs to prescribe/ please contact pt with more info or when ready @[phone] / thanks sr

## 2014-04-22 ENCOUNTER — Ambulatory Visit (INDEPENDENT_AMBULATORY_CARE_PROVIDER_SITE_OTHER): Payer: Self-pay | Admitting: Family Medicine

## 2014-04-22 ENCOUNTER — Encounter: Payer: Self-pay | Admitting: Family Medicine

## 2014-04-22 ENCOUNTER — Other Ambulatory Visit: Payer: Self-pay | Admitting: Family Medicine

## 2014-04-22 VITALS — BP 147/80 | HR 116 | Temp 97.9°F | Ht 64.0 in | Wt 244.0 lb

## 2014-04-22 DIAGNOSIS — M2550 Pain in unspecified joint: Secondary | ICD-10-CM

## 2014-04-22 DIAGNOSIS — G894 Chronic pain syndrome: Secondary | ICD-10-CM

## 2014-04-22 DIAGNOSIS — G8929 Other chronic pain: Secondary | ICD-10-CM

## 2014-04-22 DIAGNOSIS — F32A Depression, unspecified: Secondary | ICD-10-CM

## 2014-04-22 DIAGNOSIS — F329 Major depressive disorder, single episode, unspecified: Secondary | ICD-10-CM

## 2014-04-22 DIAGNOSIS — M79605 Pain in left leg: Secondary | ICD-10-CM

## 2014-04-22 DIAGNOSIS — Z7189 Other specified counseling: Secondary | ICD-10-CM

## 2014-04-22 MED ORDER — OXYCODONE-ACETAMINOPHEN 10-325 MG PO TABS: 1.0000 | ORAL_TABLET | Freq: Three times a day (TID) | ORAL | Status: DC | PRN

## 2014-04-22 NOTE — Progress Notes (Signed)
Patient ID: Debra Everett, female   DOB: Mar 26, 1960, 54 y.o.   MRN: [id number]    Subjective: CC: chronic pain 2/2 RA; situational depression HPI: Patient is a 54 y.o. female presenting to clinic today for office visit. Concerns today include:  1. Chronic pain Patient with continued pain in extremities 2/2 RA.  Seeing Rheumatologist and is currently taking Prednisone 10 daily and Methotrexate 7.5mg  once weekly.  She was prescribed Percocet 10 as well, which she is now out of.  She reports that pain has improved some with current regimen.  She denies that Percocet causes excessive sedation and reports using medication only as directed.  She was referred to [facility] (Dr [facility]) who she is eager to start seeing but is limited 2/2 medicaid still being processed.  She was told that this could take up to 3 months to go through.  In the interim was recommended by her RA provider to ask PCP to continue prescribing this.  Patient reports that she continues to ambulate with cane and uses wheel chair intermittently when doing heavy chores like cooking.    2. Leg cramp  She also notes intermittent leg cramps over last few months.  Occasionally incorporates green leafy vegetables.  Minimal physical activity 2/2 polyarthralgia.    3. Depression Patient reports to me several family deaths over the last year, including her son who was shot to death last year.  She states that this combined with her physical condition makes her very sad.  She endorses poor motivation, difficulty concentrating, excessive sleep and isolation from friends and family.  Denies HI/SI.  She is open to seeking help for this and is willing to go to [facility] for counseling as well.  She feels well supported by her husband.  FamHx and MedHx updated.  Please see EMR. Health Maintenance: colonoscopy, mammogram due  ROS: All other systems reviewed and are negative.  Objective: Office vital signs reviewed. BP 147/80 mmHg  Pulse  116  Temp(Src) 97.9 F (36.6 C) (Oral)  Ht 5\' 4"  (1.626 m)  Wt 244 lb (110.678 kg)  BMI 41.86 kg/m2  Physical Examination:  General: Awake, alert, obese female, sitting in wheel chair, NAD, accompanied to appt by husband HEENT: Normal, EOMI Cardio: RRR, S1S2 heard, no murmurs appreciated Pulm: CTAB, no wheezes, rhonchi or rales Extremities: WWP, +2 pulses bilaterally, no bony deformities, AROM decreased 2/2 pain MSK: comes to office in wheelchair today Skin: dry, intact, no rashes or lesions Neuro: sensation grossly in tact Psych: good eye contact, tearful, normal speech  PHQ-9 score: 20, see AVS  Assessment: 54 y.o. female with chronic pain, situational depression  Plan: See Problem List and After Visit Summary   Janora Norlander, DO PGY-1, [place]

## 2014-04-22 NOTE — Assessment & Plan Note (Signed)
PHQ-9 score 20.  Patient agrees to be seen at [facility] for counseling and likely SSRI. No SI/HI.  Good support from husband. Beverly Sessions number given, see AVS

## 2014-04-22 NOTE — Assessment & Plan Note (Signed)
Discussed with patient that I am agreeable to providing her with Percocet short term until Medicaid is approved -Pain contract reviewed & signed -UDS obtained -Reminder of Narcotic rules in AVS -[place] Narcotic database reviewed, no red flags -Continue RA medications -Percocet 10 Rx'd today -Patient to schedule appt with me next month for RF on this -Patient to see Dr Lennox Grumbles ASA medicaid approved. Anticipate May/June

## 2014-04-22 NOTE — Assessment & Plan Note (Signed)
Cramps in legs.  Unclear if 2/2 to underlying autoimmune dz -Encouraged to eat foods rich in potassium.  Increase fruits and veggies

## 2014-04-22 NOTE — Patient Instructions (Signed)
It was a pleasure seeing you today, [name]!  Information regarding what we discussed is included in this packet.  Please make an appointment to see me in 1 month.  I recommend that you see [facility] for counseling and medication for your anxiety and depression.  Their number is [phone].  You likely will have to walk in for an appointment.  Continue to take the medications prescribed by the rheumatologist as directed.  Incorporate more green leafy vegetables and fruits into your diet.  This will help with cramping in your legs.  Narcotic Guidelines:  1. You cannot get an early refill, even it is lost.  2. You cannot get pain medications from any other doctor, unless it is the emergency department and related to a new problem or injury.  3. You cannot use alcohol, marijuana, cocaine or any other recreational drugs while using this medication. This is very dangerous.  4. You are willing to have your urine drug tested at each visit.  5. You will not drive while using this medication, because that can put yourself and others in serious danger of an accident. 6. If any medication is stolen, then there must be a police report to verify it, or it cannot be refilled.  7. I will not prescribe these medications for longer than 3 months.  8. You must bring your pill bottle to each visit.  9. You must use the same pharmacy for all refills for the medication, unless you clear it with me beforehand.  10. You cannot share or sell this medication.  11. Always take a stool softener to prevent constipation.  Please feel free to call our office at [phone] if any questions or concerns arise.  Warm Regards, Davanna He M. Talvin Christianson, DO  Leg Cramps Leg cramps that occur during exercise can be caused by poor circulation or dehydration. However, muscle cramps that occur at rest or during the night are usually not due to any serious medical problem. Heat cramps may cause muscle spasms during hot weather.   CAUSES There is no clear cause for muscle cramps. However, dehydration may be a factor for those who do not drink enough fluids and those who exercise in the heat. Imbalances in the level of sodium, potassium, calcium or magnesium in the muscle tissue may also be a factor. Some medications, such as water pills (diuretics), may cause loss of chemicals that the body needs (like sodium and potassium) and cause muscle cramps. TREATMENT   Make sure your diet has enough fluids and essential minerals for the muscle to work normally.  Avoid strenuous exercise for several days if you have been having frequent leg cramps.  Stretch and massage the cramped muscle for several minutes.  Some medicines may be helpful in some patients with night cramps. Only take over-the-counter or prescription medicines as directed by your caregiver. SEEK IMMEDIATE MEDICAL CARE IF:   Your leg cramps become worse.  Your foot becomes cold, numb, or blue. Document Released: 02/23/2004 Document Revised: 04/09/2011 Document Reviewed: 02/10/2008 [facility] Patient Information 2015 [place], [place]. This information is not intended to replace advice given to you by your health care provider. Make sure you discuss any questions you have with your health care provider.

## 2014-04-23 LAB — DRUG SCR UR, PAIN MGMT, REFLEX CONF
Amphetamine Screen, Ur: NEGATIVE
Barbiturate Quant, Ur: NEGATIVE
Benzodiazepines.: NEGATIVE
Cocaine Metabolites: NEGATIVE
Creatinine,U: 103.86 mg/dL
Marijuana Metabolite: NEGATIVE
Methadone: NEGATIVE
Phencyclidine (PCP): NEGATIVE
Propoxyphene: NEGATIVE

## 2014-04-26 LAB — OPIATES/OPIOIDS (LC/MS-MS)
Codeine Urine: NEGATIVE ng/mL (ref <50)
Hydrocodone: NEGATIVE ng/mL (ref <50)
Hydromorphone: NEGATIVE ng/mL (ref <50)
Morphine Urine: NEGATIVE ng/mL (ref <50)
Norhydrocodone, Ur: NEGATIVE ng/mL (ref <50)
Noroxycodone, Ur: 7146 ng/mL — ABNORMAL HIGH (ref <50)
Oxycodone, ur: 8710 ng/mL — ABNORMAL HIGH (ref <50)
Oxymorphone: 4698 ng/mL — ABNORMAL HIGH (ref <50)

## 2014-04-27 ENCOUNTER — Telehealth: Payer: Self-pay | Admitting: *Deleted

## 2014-04-27 NOTE — Telephone Encounter (Signed)
Thank you for this information and for aiding in the care of my patient.

## 2014-04-27 NOTE — Telephone Encounter (Signed)
Patient does not have insurance and went to DSS to apply for Medicaid.  Was told by DSS to get meds refilled at [facility].  Per Elray Mcgregor, RN--patient is established with [facility] and was seen by PCP on 04/22/14.  Patient needs assistance with med co-pays for folic acid, methotrexate, prednisone, and Percocet for rheumatoid arthritis.  Spoke with [facility] pharmacy--co-pays for folic acid, methrotrexate, and prednisone are $48.03 and $38.35 for Percocet at [facility].  Ok to give assistance for meds per Kelli Churn.  Patient given $86 to purchase meds and instructed on location of [facility] pharmacy.  Burna Forts, BSN, RN-BC

## 2014-05-19 ENCOUNTER — Encounter: Payer: Self-pay | Admitting: Family Medicine

## 2014-05-19 ENCOUNTER — Ambulatory Visit (INDEPENDENT_AMBULATORY_CARE_PROVIDER_SITE_OTHER): Payer: Self-pay | Admitting: Family Medicine

## 2014-05-19 VITALS — BP 131/87 | HR 103 | Temp 98.3°F | Ht 64.0 in | Wt 244.0 lb

## 2014-05-19 DIAGNOSIS — F32A Depression, unspecified: Secondary | ICD-10-CM

## 2014-05-19 DIAGNOSIS — F329 Major depressive disorder, single episode, unspecified: Secondary | ICD-10-CM

## 2014-05-19 DIAGNOSIS — G8929 Other chronic pain: Secondary | ICD-10-CM

## 2014-05-19 DIAGNOSIS — Z7189 Other specified counseling: Secondary | ICD-10-CM

## 2014-05-19 MED ORDER — OXYCODONE-ACETAMINOPHEN 10-325 MG PO TABS: 1.0000 | ORAL_TABLET | Freq: Three times a day (TID) | ORAL | Status: DC | PRN

## 2014-05-19 NOTE — Assessment & Plan Note (Signed)
[place] database shows Percocet 10-325 filled at [place] on 3/28, then at [facility] on 3/29.  [facility]) and this medication was voided (so never actually filled for patient).  Patient's pain improving.  Medicaid still pending.  Again, expect patient to be set up with [facility] sometime in May or June. -RF Percocet 10 x90 with no RF -UDS at next visit

## 2014-05-19 NOTE — Progress Notes (Signed)
Patient ID: Debra Everett, female   DOB: July 06, 1960, 54 y.o.   MRN: [id number]    Subjective: CC: chronic pain management/ depression HPI: Patient is a 54 y.o. female presenting to clinic today for Chronic pain management.  Chronic pain: Patient presenting to clinic for follow up on chronic pain management.  Patient reports that they are doing well on Percocet 10.  They take 2-3 tablets daily.  Patient reports that her pain is migratory but is well controlled.  Pain is 3/10 today.  She states that she is ambulating much more independently now.  She uses the walker for assistance, but states that she is becoming more independent with her ADLs.  She has been cooking more and has become intimate with her husband again.  We reviewed her [place] database and she reports that the 3/28 Percocet that shows filled at [place] is when she brought medication to [facility] and it was too expensive there.  They gave her the Rx back and she filled it at the outpatient pharmacy here at [facility].   Denies somnolence, dizziness, difficulty breathing, constipation.  Patient also reports that she is seeing a new rheumatologist on [facility].  Next appointment 06/03/14.  Depression Patient reports that she went to [facility] the day after our last visit.  She states that she has been working closely with a therapist there each week and feels that she is doing better.  She states that she started Trazodone and Cymbalta, which she feels to be helpful.  She does note that she is having nightmares about "the fire" recently.  She attributes this to the trazodone.  She is to discuss this further with her [facility] provider at tomorrow's appointment.  Denies SI/HI.  Endorses continued depressive symptoms but improved from last month.  FamHx and MedHx updated.  Please see EMR.  ROS: All other systems reviewed and are negative.  Objective: Office vital signs reviewed. BP 131/87 mmHg  Pulse 103  Temp(Src) 98.3 F (36.8 C) (Oral)  Ht 5\' 4"   (1.626 m)  Wt 244 lb (110.678 kg)  BMI 41.86 kg/m2  SpO2 98%  Physical Examination:  General: Awake, alert, well nourished, brighter appearing female, NAD HEENT: Normal, EOMI Cardio: RRR, S1S2 heard, no murmurs appreciated Pulm: CTAB, no wheezes, rhonchi or rales MSK: uses walker for ambulation, slow gait Psych: good eye contact, normal speech, mood stable, affect appropriate  PHQ-9 score: 11, see EMR  Assessment: 54 y.o. female with pain 2/2 RA and depression  Plan: See Problem List and After Visit Summary   Janora Norlander, DO PGY-1, [place]

## 2014-05-19 NOTE — Patient Instructions (Addendum)
It was a pleasure seeing you today, Ms Ouida Sills!  Information regarding what we discussed is included in this packet.  Please make an appointment to see me in 1 month for pain management if your Medicaid has not been approved by then.  Otherwise, schedule an appointment with [facility] and schedule with me as needed.  I am so happy to see that you are making progress!  Keep up the good work!  Narcotic Guidelines:  1. You cannot get an early refill, even it is lost.  2. You cannot get pain medications from any other doctor, unless it is the emergency department and related to a new problem or injury.  3. You cannot use alcohol, marijuana, cocaine or any other recreational drugs while using this medication. This is very dangerous.  4. You are willing to have your urine drug tested at each visit.  5. You will not drive while using this medication, because that can put yourself and others in serious danger of an accident. 6. If any medication is stolen, then there must be a police report to verify it, or it cannot be refilled.  7. I will not prescribe these medications for longer than 3 months.  8. You must bring your pill bottle to each visit.  9. You must use the same pharmacy for all refills for the medication, unless you clear it with me beforehand.  10. You cannot share or sell this medication.  11. Always take a stool softener to prevent constipation.  Please feel free to call our office at [phone] if any questions or concerns arise.  Warm Regards, Mukund Weinreb M. Lajuana Ripple, DO

## 2014-05-19 NOTE — Assessment & Plan Note (Addendum)
PHQ-9: 11.  Patient improving with counseling and medical therapy being provided at [facility], Trazodone -Continue counseling with [facility]

## 2014-06-22 ENCOUNTER — Other Ambulatory Visit: Payer: Self-pay | Admitting: Family Medicine

## 2014-06-22 DIAGNOSIS — G8929 Other chronic pain: Secondary | ICD-10-CM

## 2014-06-22 NOTE — Telephone Encounter (Signed)
Pt is aware and voiced understanding. Jamesetta Greenhalgh,CMA  

## 2014-06-22 NOTE — Telephone Encounter (Signed)
Pt called and needs a refill on her Oxycodone left up front for pick up. She will be going out of town for a funeral and would like to get this today. jw

## 2014-06-22 NOTE — Telephone Encounter (Signed)
As we have discussed previously.  This is inappropriate without an office visit.  Please relay to patient.

## 2014-06-25 ENCOUNTER — Encounter: Payer: Self-pay | Admitting: Family Medicine

## 2014-06-25 ENCOUNTER — Ambulatory Visit (INDEPENDENT_AMBULATORY_CARE_PROVIDER_SITE_OTHER): Payer: Self-pay | Admitting: Family Medicine

## 2014-06-25 VITALS — BP 134/91 | HR 114 | Temp 98.3°F | Ht 64.0 in | Wt 254.0 lb

## 2014-06-25 DIAGNOSIS — Z7189 Other specified counseling: Secondary | ICD-10-CM

## 2014-06-25 DIAGNOSIS — G8929 Other chronic pain: Secondary | ICD-10-CM

## 2014-06-25 DIAGNOSIS — Z0000 Encounter for general adult medical examination without abnormal findings: Secondary | ICD-10-CM

## 2014-06-25 MED ORDER — OXYCODONE-ACETAMINOPHEN 10-325 MG PO TABS: 1.0000 | ORAL_TABLET | Freq: Three times a day (TID) | ORAL | Status: DC | PRN

## 2014-06-25 NOTE — Progress Notes (Signed)
Patient ID: Debra Everett, female   DOB: August 17, 1960, 54 y.o.   MRN: [id number]    Subjective: CC: chronic pain HPI: Patient is a 54 y.o. female presenting to clinic today for office visit. Concerns today include:  Chronic pain management  Patient presenting to clinic for follow up on chronic pain management.  Patient reports that she is doing well on Percocet 10.  Patient takes no more than the prescribed amount of tablets daily.  Patient reports that pain in her feet and legs is well controlled.  Pain is 7/10 today.  She notes that there was a recent death in the family and she thinks this is making her pain worse.  She reports that she is still having difficulty getting disability medicaid 2/2 inappropriately documentation.  She states that she was quoted 4-6 weeks to have this resolved.  Patient notes that her prednisone dosing was recently changed to 2.5mg  daily with methotrexate 6 tabs on Fridays.  She notes that her pain also has increased since this change was made.  She has an appointment with Rheumatology next month.  Denies somnolence, dizziness, difficulty breathing.   Social History Reviewed: non smoker. FamHx and MedHx updated.  Please see EMR. Health Maintenance: mammogram and colonoscopy due  ROS: All other systems reviewed and are negative.  Objective: Office vital signs reviewed. BP 134/91 mmHg  Pulse 114  Temp(Src) 98.3 F (36.8 C) (Oral)  Ht 5\' 4"  (1.626 m)  Wt 254 lb (115.214 kg)  BMI 43.58 kg/m2  Physical Examination:  General: Awake, alert, obese female, NAD HEENT: Normal, EOMI Cardio: HR 88 on my exam, S1S2 heard, no murmurs appreciated Pulm: CTAB, no wheezes, rhonchi or rales GI: obese, NT/ND,+BS x4, no hepatomegaly, no splenomegaly Extremities: WWP, some mild joint swelling in hands but no increased warmth or redness.  no cyanosis or clubbing; +2 radial pulses bilaterally MSK: Antalgic gait and normal station, uses cane for  ambulation  Assessment: 54 y.o. female with chronic pain  Plan: See Problem List and After Visit Summary  Patient is due for a screening mammogram.  Information on imaging center given to patient to call and schedule mammogram appointment.    Janora Norlander, DO PGY-1, [place]

## 2014-06-25 NOTE — Patient Instructions (Signed)
It was a pleasure seeing you today, [name]!  Information regarding what we discussed is included in this packet.   Narcotic Guidelines:  1. You cannot get an early refill, even it is lost.  2. You cannot get pain medications from any other doctor, unless it is the emergency department and related to a new problem or injury.  3. You cannot use alcohol, marijuana, cocaine or any other recreational drugs while using this medication. This is very dangerous.  4. You are willing to have your urine drug tested at each visit.  5. You will not drive while using this medication, because that can put yourself and others in serious danger of an accident. 6. If any medication is stolen, then there must be a police report to verify it, or it cannot be refilled.  7. I will not prescribe these medications for longer than 3 months.  8. You must bring your pill bottle to each visit.  9. You must use the same pharmacy for all refills for the medication, unless you clear it with me beforehand.  10. You cannot share or sell this medication.  11. Always take a stool softener to prevent constipation.  Please feel free to call our office at [phone] if any questions or concerns arise.  Warm Regards, Suzzette Gasparro M. Lajuana Ripple, DO

## 2014-06-26 DIAGNOSIS — Z0000 Encounter for general adult medical examination without abnormal findings: Secondary | ICD-10-CM | POA: Insufficient documentation

## 2014-06-26 NOTE — Assessment & Plan Note (Addendum)
-   Mammography and colonoscopy recommended and information given to patient.

## 2014-06-26 NOTE — Assessment & Plan Note (Signed)
-  Discussed that next visit is her last visit where I will prescribe Percocet if she is not set up with Medicaid before then, as this was our original agreement.  Patient voices good understanding and will continue to work closely with case manager to get Disability so that she can go to Pain Management. -[place] Narcotics database reviewed, no red flags -Percocet 10 refilled #90, 0RF

## 2014-07-28 ENCOUNTER — Encounter: Payer: Self-pay | Admitting: Family Medicine

## 2014-07-28 ENCOUNTER — Ambulatory Visit (INDEPENDENT_AMBULATORY_CARE_PROVIDER_SITE_OTHER): Payer: Self-pay | Admitting: Family Medicine

## 2014-07-28 VITALS — BP 128/82 | HR 89 | Temp 99.0°F | Wt 256.6 lb

## 2014-07-28 DIAGNOSIS — Z7189 Other specified counseling: Secondary | ICD-10-CM

## 2014-07-28 DIAGNOSIS — F329 Major depressive disorder, single episode, unspecified: Secondary | ICD-10-CM

## 2014-07-28 DIAGNOSIS — G8929 Other chronic pain: Secondary | ICD-10-CM

## 2014-07-28 DIAGNOSIS — F32A Depression, unspecified: Secondary | ICD-10-CM

## 2014-07-28 MED ORDER — OXYCODONE-ACETAMINOPHEN 10-325 MG PO TABS: 1.0000 | ORAL_TABLET | Freq: Three times a day (TID) | ORAL | Status: DC | PRN

## 2014-07-28 NOTE — Progress Notes (Signed)
Patient ID: Avon Gully, female   DOB: [birth year], 54 y.o.   MRN: [id number]    Subjective: [id number] pain HPI: Patient is a 54 y.o. female presenting to clinic today for office visit. Concerns today include:  Chronic pain management  Patient presenting to clinic for follow up on chronic pain management.  Patient reports that they are doing well on Percocet 10mg .  They take 1 tablet 3x daily.  Patient reports that rheumatoid pain is well controlled on medications.  Pain is 2/10 today.  She states that she continues to take 1/2 tablet of Prednisone daily and Takes Methotrexate each Friday.  She has an appointment with Rheumatology in August.  She reports to me today that she is still having difficulties with Medicaid paperwork not being completed.  We discussed at length that this would be the last fill for her Percocet from this clinic, as patient has an active referral to pain management.  Per the contract that was made with the patient at her initial visit, it has been the understanding that June would be the endpoint for narcotic prescriptions, as 4 months was more than enough time to get Medicaid worked out.  This was something that has been reiterated at each visit.  Patient voiced good understanding and will work on getting Medicaid or pay out of pocket for pain management.  Denies somnolence, dizziness, difficulty breathing, constipation.  Depression  Patient continues to see [facility] each week for counseling services and every 3 months for psychiatric medication management.  She was recently changed from Cymbalta and Trazodone to Celexa, Hydroxyzine and Prazosin (for nightmares).  She reports that she is tolerating these medications much better than the previous.  She reports that she is more alert and interactive with her peers/family.  Her intimate relationship with her husband has improved.  Denies depressive symptoms.  Social History Reviewed: non smoker. FamHx and MedHx updated.   Please see EMR. Health Maintenance: Mammogram and colonoscopy due but still waiting on Medicaid  ROS: All other systems reviewed and are negative.  Objective: Office vital signs reviewed. BP 128/82 mmHg  Pulse 89  Temp(Src) 99 F (37.2 C)  Wt 256 lb 9.6 oz (116.393 kg)  Physical Examination:  General: Awake, alert, obese female, NAD, accompanied to appt by husband HEENT: Normal, EOMI, MMM Cardio: RRR, S1S2 heard, no murmurs appreciated Pulm: CTAB, no wheezes, rhonchi or rales Extremities: WWP, No edema, cyanosis or clubbing; +2 pulses bilaterally, no bony deformities or joint swelling today. FROM [facility]: Walks with cane, normal station Psych: good eye contact, mood stable, pleasant  Assessment: 54 y.o. female with chronic pain 2/2 rheumatoid arthritis and depression  Plan: See Problem List and After Visit Summary   Janora Norlander, DO PGY-1, [place]

## 2014-07-28 NOTE — Assessment & Plan Note (Signed)
Continues to see [facility] weekly for therapy.  Next visit with psychiatrist in 3 months.  Now off Trazodone and Cymbalta.  Hydroxyzine, Celexa and Prazosin started.  Patient doing really well on new medication regimen. -Continue current therapies -Patient to f/u with [facility] as scheduled

## 2014-07-28 NOTE — Patient Instructions (Addendum)
It was a pleasure seeing you today, Ms Ouida Sills!  Information regarding what we discussed is included in this packet.  Please make sure to follow up with pain management for further refills on your Percocet.  Today is the LAST time that this medication will be filled at this office, per the contract that was signed earlier this year.   Narcotic Guidelines:    You cannot get an early refill, even it is lost.    You cannot use alcohol, marijuana, cocaine or any other recreational drugs while using this medication. This is very dangerous.    You are willing to have your urine drug tested at each visit.    You will not drive while using this medication, because that can put yourself and others in serious danger of an accident.   If any medication is stolen, then there must be a police report to verify it, or it cannot be refilled.    You must use the same pharmacy for all refills for the medication, unless you clear it with me beforehand.    You cannot share or sell this medication.    Always take a stool softener to prevent constipation.  Please feel free to call our office at [phone] if any questions or concerns arise.  Warm Regards, Denny Mccree M. Lajuana Ripple, DO

## 2014-07-28 NOTE — Assessment & Plan Note (Signed)
Patient still working on [employer].  Reiterated with patient that today was the last fill for Percocet as outlined in initial encounter and reemphasized in subsequent encounters.  Patient has referral to pain management.  She continues to see Rheumatology. -Continue Prednisone and Methotrexate as directed by Rheum -Rx for Percocet 10mg  #90 provided today. -Narcotic database reviewed. -Patient to follow up as needed

## 2014-08-18 ENCOUNTER — Encounter (HOSPITAL_COMMUNITY): Payer: Self-pay

## 2014-08-18 ENCOUNTER — Emergency Department (HOSPITAL_COMMUNITY)
Admission: EM | Admit: 2014-08-18 | Discharge: 2014-08-18 | Disposition: A | Discharge status: Home / Self Care | Payer: Medicaid Other | Attending: Emergency Medicine | Admitting: Emergency Medicine

## 2014-08-18 DIAGNOSIS — M069 Rheumatoid arthritis, unspecified: Secondary | ICD-10-CM | POA: Insufficient documentation

## 2014-08-18 DIAGNOSIS — D649 Anemia, unspecified: Secondary | ICD-10-CM | POA: Insufficient documentation

## 2014-08-18 DIAGNOSIS — M25572 Pain in left ankle and joints of left foot: Secondary | ICD-10-CM | POA: Diagnosis present

## 2014-08-18 DIAGNOSIS — Z79899 Other long term (current) drug therapy: Secondary | ICD-10-CM | POA: Insufficient documentation

## 2014-08-18 DIAGNOSIS — R2689 Other abnormalities of gait and mobility: Secondary | ICD-10-CM | POA: Diagnosis not present

## 2014-08-18 DIAGNOSIS — F419 Anxiety disorder, unspecified: Secondary | ICD-10-CM | POA: Diagnosis not present

## 2014-08-18 DIAGNOSIS — Z87891 Personal history of nicotine dependence: Secondary | ICD-10-CM | POA: Diagnosis not present

## 2014-08-18 DIAGNOSIS — Z86018 Personal history of other benign neoplasm: Secondary | ICD-10-CM | POA: Insufficient documentation

## 2014-08-18 DIAGNOSIS — Z7952 Long term (current) use of systemic steroids: Secondary | ICD-10-CM | POA: Diagnosis not present

## 2014-08-18 HISTORY — DX: Unspecified osteoarthritis, unspecified site: M19.90

## 2014-08-18 MED ORDER — HYDROMORPHONE HCL 1 MG/ML IJ SOLN
2.0000 mg | Freq: Once | INTRAMUSCULAR | Status: AC
  Administered 2014-08-18: 2 mg via INTRAMUSCULAR
  Filled 2014-08-18: qty 2

## 2014-08-18 MED ORDER — PREDNISONE 20 MG PO TABS
60.0000 mg | ORAL_TABLET | Freq: Once | ORAL | Status: AC
  Administered 2014-08-18: 60 mg via ORAL
  Filled 2014-08-18: qty 3

## 2014-08-18 MED ORDER — KETOROLAC TROMETHAMINE 60 MG/2ML IM SOLN
60.0000 mg | Freq: Once | INTRAMUSCULAR | Status: AC
  Administered 2014-08-18: 60 mg via INTRAMUSCULAR
  Filled 2014-08-18: qty 2

## 2014-08-18 MED ORDER — OXYCODONE-ACETAMINOPHEN 5-325 MG PO TABS: 1.0000 | ORAL_TABLET | Freq: Four times a day (QID) | ORAL | Status: DC | PRN

## 2014-08-18 MED ORDER — PREDNISONE 20 MG PO TABS
ORAL_TABLET | ORAL | Status: DC
Start: 2014-08-18 — End: 2014-08-25

## 2014-08-18 NOTE — ED Provider Notes (Signed)
CSN: [id number]     Arrival date & time 08/18/14  4967 History   First MD Initiated Contact with Patient 08/18/14 [phone]     Chief Complaint  Patient presents with  . Joint Pain  . Joint Swelling     (Consider location/radiation/quality/duration/timing/severity/associated sxs/prior Treatment) HPI Comments: Patient with recent diagnosis of rheumatoid arthritis earlier this year, followed at [facility], presents with increasing debilitating joint pain which has been worse over the past several days. Patient is currently on methotrexate once a week, Percocet, 10 mg prednisone daily. Patient discontinued methotrexate recently for several weeks but has now restarted. She continues to take her prednisone. Patient denies other medical complaints including fever, nausea, vomiting, diarrhea. Patient has worse pain in her knees, feet, and right shoulder. She requires help getting around and with ambulation. Her main complaint currently is pain. Patient was prescribed #90 Percocet 10-3 25 on 07/28/14. Patient admits to taking these every 4 hours instead of every 8 hours as prescribed and she has now run out of her pain medication. Onset of symptoms acute on chronic. Course is constant. Movement makes the pain worse.  The history is provided by the patient, medical records and a parent.    Past Medical History  Diagnosis Date  . Headache(784.0)   . Anemia     as teenager  . Fibroid   . Anxiety   . Arthritis    Past Surgical History  Procedure Laterality Date  . Abdominal surgery    . Appendectomy     Family History  Problem Relation Age of Onset  . Breast cancer Sister     25s   History  Substance Use Topics  . Smoking status: Former Smoker -- 1.00 packs/day    Types: Cigarettes    Start date: 08/21/1980  . Smokeless tobacco: Not on file  . Alcohol Use: No   OB History    Gravida Para Term Preterm AB TAB SAB Ectopic Multiple Living   3 3 3  0 0 0 0 0 0 2     Review of Systems   Constitutional: Negative for fever.  HENT: Negative for rhinorrhea and sore throat.   Eyes: Negative for redness.  Respiratory: Negative for cough.   Cardiovascular: Negative for chest pain.  Gastrointestinal: Negative for nausea, vomiting, abdominal pain and diarrhea.  Genitourinary: Negative for dysuria.  Musculoskeletal: Positive for joint swelling, arthralgias and gait problem. Negative for myalgias.  Skin: Negative for rash.  Neurological: Negative for headaches.      Allergies  Review of patient's allergies indicates no known allergies.  Home Medications   Prior to Admission medications   Medication Sig Start Date End Date Taking? Authorizing Provider  folic acid (FOLVITE) 1 MG tablet Take 1 mg by mouth daily.  04/02/14  Yes Historical Provider, MD  hydrOXYzine (ATARAX/VISTARIL) 25 MG tablet Take 25 mg by mouth 3 (three) times daily as needed.   Yes Historical Provider, MD  methotrexate (RHEUMATREX) 2.5 MG tablet Take 15 mg by mouth once a week.  04/02/14  Yes Historical Provider, MD  oxyCODONE-acetaminophen (PERCOCET) 10-325 MG per tablet Take 1 tablet by mouth every 8 (eight) hours as needed for pain. 07/28/14  Yes Ashly Windell Moulding, DO  prazosin (MINIPRESS) 1 MG capsule Take 1 mg by mouth at bedtime.   Yes Historical Provider, MD  predniSONE (DELTASONE) 10 MG tablet Take 10 mg by mouth daily with breakfast.  04/02/14  Yes Historical Provider, MD   BP 128/98 mmHg  Pulse 103  Temp(Src) 98.3 F (36.8 C) (Oral)  Resp 17  Ht 5\' 2"  (1.575 m)  Wt 240 lb (108.863 kg)  BMI 43.89 kg/m2  SpO2 95% Physical Exam  Constitutional: She appears well-developed and well-nourished.  HENT:  Head: Normocephalic and atraumatic.  Eyes: Conjunctivae are normal. Right eye exhibits no discharge. Left eye exhibits no discharge.  Neck: Normal range of motion. Neck supple.  Cardiovascular: Normal rate, regular rhythm and normal heart sounds.   Pulmonary/Chest: Effort normal and breath sounds  normal.  Abdominal: Soft. There is no tenderness.  Musculoskeletal: She exhibits tenderness.  Patient with generalized pain with movement and mild swelling of her knees and ankles. She also has significant pain with movement of her right shoulder. Other joints are generally tender.  Neurological: She is alert.  Skin: Skin is warm and dry.  Psychiatric: She has a normal mood and affect.  Nursing note and vitals reviewed.   ED Course  Procedures (including critical care time) Labs Review Labs Reviewed - No data to display  Imaging Review No results found.   EKG Interpretation None       8:58 AM Patient seen and examined. Work-up initiated. Medications ordered.   Vital signs reviewed and are as follows: BP 128/98 mmHg  Pulse 103  Temp(Src) 98.3 F (36.8 C) (Oral)  Resp 17  Ht 5\' 2"  (1.575 m)  Wt 240 lb (108.863 kg)  BMI 43.89 kg/m2  SpO2 95%  10:30 AM patient with some improvement. Discussed with Dr. Jeneen Rinks earlier.   Plan: D/c to home with #10 Percocet 5-325, 12 day taper of prednisone back to her maintenance dose.   Urged patient to call her rheumatologist for instructions today. Encouraged PCP follow-up as well.  Patient and I had a discussion regarding using her pain medications appropriately. I understand why she was taking more pain medication been instructed given her painful condition, however we discussed why this is not appropriate and that she needs to seek other means to control pain. Encouraged to strictly adhere to her prednisone and methotrexate regimens from now on as these will help control her rheumatoid arthritis. I have given her a small amount of pain medication to use while of steroids take effect. I do not suspect drug-seeking behavior.   MDM   Final diagnoses:  Rheumatoid arthritis   Patient with pain due to her rheumatoid arthritis, likely as a result of not being fully compliant with her controller medications. Symptoms improved in emergency  department. Will give short term steroid taper. Encouraged follow-up with her rheumatologist and PCP. No fevers or other systemic symptoms.   Carlisle Cater, PA-C 08/18/14 [place], MD 08/19/14 1006

## 2014-08-18 NOTE — ED Notes (Signed)
Pt has hx of RA and sees a Rheumatologist. Pt takes prednisone at home. Pt reports pain increased starting last Thursday and has noticed joint swelling in her knees and shoulders. Pt reports she requires help to do daily activities.

## 2014-08-18 NOTE — Discharge Instructions (Signed)
Please read and follow all provided instructions.  Your diagnoses today include:  1. Rheumatoid arthritis    Tests performed today include:  Vital signs. See below for your results today.   Medications prescribed:   Prednisone - steroid medicine   It is best to take this medication in the morning to prevent sleeping problems. If you are diabetic, monitor your blood sugar closely and stop taking Prednisone if blood sugar is over 300. Take with food to prevent stomach upset.    Percocet (oxycodone/acetaminophen) - narcotic pain medication  DO NOT drive or perform any activities that require you to be awake and alert because this medicine can make you drowsy. BE VERY CAREFUL not to take multiple medicines containing Tylenol (also called acetaminophen). Doing so can lead to an overdose which can damage your liver and cause liver failure and possibly death.  Take any prescribed medications only as directed.  Home care instructions:  Follow any educational materials contained in this packet.  BE VERY CAREFUL not to take multiple medicines containing Tylenol (also called acetaminophen). Doing so can lead to an overdose which can damage your liver and cause liver failure and possibly death.   Follow-up instructions: Please follow-up with your primary care provider in the next 7 days for further evaluation of your symptoms.   Call your rheumatologist and let them know what is going on.   Return instructions:   Please return to the Emergency Department if you experience worsening symptoms.   Please return if you have any other emergent concerns.  Additional Information:  Your vital signs today were: BP 124/85 mmHg   Pulse 101   Temp(Src) 98.3 F (36.8 C) (Oral)   Resp 17   Ht 5\' 2"  (1.575 m)   Wt 240 lb (108.863 kg)   BMI 43.89 kg/m2   SpO2 92% If your blood pressure (BP) was elevated above 135/85 this visit, please have this repeated by your doctor within one  month. --------------

## 2014-08-23 ENCOUNTER — Telehealth: Payer: Self-pay | Admitting: Family Medicine

## 2014-08-23 NOTE — Telephone Encounter (Signed)
Mrs. Debra Everett is calling because she states that her PCP instructed her to call if necessary. She needs "that letter for medicaid". She states that the letter is to state her condition so that it would help her to obtain medicaid. Please follow up with [name] at the earliest convenience. Thank you, Fonda Kinder, ASA

## 2014-08-23 NOTE — Telephone Encounter (Signed)
Please advise patient that I will be back in the office Wednesday and will contact her at that time re: medicaid letter.

## 2014-08-25 ENCOUNTER — Encounter (HOSPITAL_COMMUNITY): Payer: Self-pay | Admitting: Emergency Medicine

## 2014-08-25 ENCOUNTER — Emergency Department (HOSPITAL_COMMUNITY)
Admission: EM | Admit: 2014-08-25 | Discharge: 2014-08-25 | Disposition: A | Discharge status: Home / Self Care | Payer: Medicaid Other | Attending: Emergency Medicine | Admitting: Emergency Medicine

## 2014-08-25 ENCOUNTER — Encounter: Payer: Self-pay | Admitting: Family Medicine

## 2014-08-25 DIAGNOSIS — M25559 Pain in unspecified hip: Secondary | ICD-10-CM | POA: Diagnosis present

## 2014-08-25 DIAGNOSIS — F419 Anxiety disorder, unspecified: Secondary | ICD-10-CM | POA: Insufficient documentation

## 2014-08-25 DIAGNOSIS — D649 Anemia, unspecified: Secondary | ICD-10-CM | POA: Insufficient documentation

## 2014-08-25 DIAGNOSIS — Z79899 Other long term (current) drug therapy: Secondary | ICD-10-CM | POA: Insufficient documentation

## 2014-08-25 DIAGNOSIS — G8929 Other chronic pain: Secondary | ICD-10-CM

## 2014-08-25 DIAGNOSIS — M069 Rheumatoid arthritis, unspecified: Secondary | ICD-10-CM | POA: Insufficient documentation

## 2014-08-25 DIAGNOSIS — Z86018 Personal history of other benign neoplasm: Secondary | ICD-10-CM | POA: Diagnosis not present

## 2014-08-25 DIAGNOSIS — M1990 Unspecified osteoarthritis, unspecified site: Secondary | ICD-10-CM | POA: Insufficient documentation

## 2014-08-25 DIAGNOSIS — Z87891 Personal history of nicotine dependence: Secondary | ICD-10-CM | POA: Diagnosis not present

## 2014-08-25 HISTORY — DX: Rheumatoid arthritis, unspecified: M06.9

## 2014-08-25 MED ORDER — NAPROXEN SODIUM 220 MG PO TABS: 220.0000 mg | ORAL_TABLET | Freq: Two times a day (BID) | ORAL | Status: DC

## 2014-08-25 MED ORDER — MORPHINE SULFATE 4 MG/ML IJ SOLN
4.0000 mg | Freq: Once | INTRAMUSCULAR | Status: AC
  Administered 2014-08-25: 4 mg via INTRAVENOUS
  Filled 2014-08-25: qty 1

## 2014-08-25 NOTE — Telephone Encounter (Signed)
Done.  Placed up front for pick up.

## 2014-08-25 NOTE — Discharge Instructions (Signed)
Chronic Pain Discharge Instructions  °Emergency care providers appreciate that many patients coming to us are in severe pain and we wish to address their pain in the safest, most responsible manner.  It is important to recognize however, that the proper treatment of chronic pain differs from that of the pain of injuries and acute illnesses.  Our goal is to provide quality, safe, personalized care and we thank you for giving us the opportunity to serve you. °The use of narcotics and related agents for chronic pain syndromes may lead to additional physical and psychological problems.  Nearly as many people die from prescription narcotics each year as die from car crashes.  Additionally, this risk is increased if such prescriptions are obtained from a variety of sources.  Therefore, only your primary care physician or a pain management specialist is able to safely treat such syndromes with narcotic medications long-term.   ° °Documentation revealing such prescriptions have been sought from multiple sources may prohibit us from providing a refill or different narcotic medication.  Your name may be checked first through the [place] Controlled Substances Reporting System.  This database is a record of controlled substance medication prescriptions that the patient has received.  This has been established by [facility] in an effort to eliminate the dangerous, and often life threatening, practice of obtaining multiple prescriptions from different medical providers.  ° °If you have a chronic pain syndrome (i.e. chronic headaches, recurrent back or neck pain, dental pain, abdominal or pelvis pain without a specific diagnosis, or neuropathic pain such as fibromyalgia) or recurrent visits for the same condition without an acute diagnosis, you may be treated with non-narcotics and other non-addictive medicines.  Allergic reactions or negative side effects that may be reported by a patient to such medications will not  typically lead to the use of a narcotic analgesic or other controlled substance as an alternative. °  °Patients managing chronic pain with a personal physician should have provisions in place for breakthrough pain.  If you are in crisis, you should call your physician.  If your physician directs you to the emergency department, please have the doctor call and speak to our attending physician concerning your care. °  °When patients come to the Emergency Department (ED) with acute medical conditions in which the Emergency Department physician feels appropriate to prescribe narcotic or sedating pain medication, the physician will prescribe these in very limited quantities.  The amount of these medications will last only until you can see your primary care physician in his/her office.  Any patient who returns to the ED seeking refills should expect only non-narcotic pain medications.  ° °In the event of an acute medical condition exists and the emergency physician feels it is necessary that the patient be given a narcotic or sedating medication -  a responsible adult driver should be present in the room prior to the medication being given by the nurse. °  °Prescriptions for narcotic or sedating medications that have been lost, stolen or expired will not be refilled in the Emergency Department.   ° °Patients who have chronic pain may receive non-narcotic prescriptions until seen by their primary care physician.  It is every patient’s personal responsibility to maintain active prescriptions with his or her primary care physician or specialist. °

## 2014-08-25 NOTE — ED Notes (Signed)
EDP at bedside  

## 2014-08-25 NOTE — ED Provider Notes (Signed)
CSN: [id number]     Arrival date & time 08/25/14  1923 History   First MD Initiated Contact with Patient 08/25/14 2125     Chief Complaint  Patient presents with  . Hip Pain  . Back Pain      HPI Patient has a long-standing history of recurrent and chronic pain.  She has rheumatoid arthritis.  She's been referred to a pain clinic but is having issues getting in there.  Her primary care physician prescribed oxycodone for short period time but is unwilling to prescribe additional pain medication.  She states she is trying to get into a pain clinic.  She is requesting something for pain at this time is requesting medication for pain at home.  She denies fevers and chills.  She reports generalized aches and all of her joints.  No other significant complaints.  She's tried Aleve without improvement in her symptoms.  No recent injury or fall.  No trauma.   Past Medical History  Diagnosis Date  . Headache(784.0)   . Anemia     as teenager  . Fibroid   . Anxiety   . Arthritis   . RA (rheumatoid arthritis)    Past Surgical History  Procedure Laterality Date  . Abdominal surgery    . Appendectomy     Family History  Problem Relation Age of Onset  . Breast cancer Sister     57s   History  Substance Use Topics  . Smoking status: Former Smoker -- 1.00 packs/day    Types: Cigarettes    Start date: 08/21/1980  . Smokeless tobacco: Not on file  . Alcohol Use: No   OB History    Gravida Para Term Preterm AB TAB SAB Ectopic Multiple Living   3 3 3  0 0 0 0 0 0 2     Review of Systems  All other systems reviewed and are negative.     Allergies  Review of patient's allergies indicates no known allergies.  Home Medications   Prior to Admission medications   Medication Sig Start Date End Date Taking? Authorizing Provider  folic acid (FOLVITE) 1 MG tablet Take 1 mg by mouth daily.  04/02/14  Yes Historical Provider, MD  hydrOXYzine (ATARAX/VISTARIL) 25 MG tablet Take 25 mg by mouth  3 (three) times daily as needed.   Yes Historical Provider, MD  methotrexate (RHEUMATREX) 2.5 MG tablet Take 15 mg by mouth once a week.  04/02/14  Yes Historical Provider, MD  oxyCODONE-acetaminophen (PERCOCET/ROXICET) 5-325 MG per tablet Take 1-2 tablets by mouth every 6 (six) hours as needed for severe pain. 08/18/14  Yes Carlisle Cater, PA-C  prazosin (MINIPRESS) 1 MG capsule Take 1 mg by mouth at bedtime.   Yes Historical Provider, MD  naproxen sodium (ANAPROX) 220 MG tablet Take 1 tablet (220 mg total) by mouth 2 (two) times daily with a meal. 08/25/14   Jola Schmidt, MD   BP 120/82 mmHg  Pulse 87  Temp(Src) 98.6 F (37 C) (Oral)  Resp 16  Ht 5\' 2"  (1.575 m)  Wt 255 lb (115.667 kg)  BMI 46.63 kg/m2  SpO2 96%  LMP 10/07/2013 Physical Exam  Constitutional: She is oriented to person, place, and time. She appears well-developed and well-nourished.  HENT:  Head: Normocephalic.  Eyes: EOM are normal.  Neck: Normal range of motion.  Pulmonary/Chest: Effort normal.  Abdominal: She exhibits no distension.  Musculoskeletal: Normal range of motion.  Neurological: She is alert and oriented to person, place, and  time.  Psychiatric: She has a normal mood and affect.  Nursing note and vitals reviewed.   ED Course  Procedures (including critical care time) Labs Review Labs Reviewed - No data to display  Imaging Review No results found.   EKG Interpretation None      MDM   Final diagnoses:  Chronic pain    Pain improved in the emergency department.  This appears to be chronic pain exacerbation.  Patient referred back to her primary care physician for ongoing pain needs.  Patient was informed that the emergency department is not the best place for treatment of chronic pain.  The patient was somewhat upset that I was unable to prescribe ongoing narcotic medication for home    Jola Schmidt, MD 08/25/14 2227

## 2014-08-25 NOTE — ED Notes (Signed)
Spoke with [place] about pt pain at 10/10. Acknowledged and put in orders to medicate

## 2014-08-25 NOTE — ED Notes (Addendum)
Pt. reports bilateral hip joint pain and low back pain onset this week unrelieved by OTC Aleve , denies injury or fall , pain increases with movement and changing positions . Denies hematuria or urinary discomfort . No fever or chills.

## 2014-08-27 NOTE — Telephone Encounter (Signed)
Spoke to pt. Informed her the forms are up front for her to pick up. Ottis Stain, CMA

## 2014-09-30 ENCOUNTER — Telehealth: Payer: Self-pay | Admitting: Family Medicine

## 2014-09-30 NOTE — Telephone Encounter (Signed)
Patient is calling and states that she was approved for medicaid. She is tearful and expressing concern for her chronic pain. She states that she has been set up with the pain management clinic however there is a wait. Due to the fact that she has medicaid she is wanting to know if there are other options that are quicker to help her manage. She states that she is in an extreme amount of pain and doesn't know what to do. She needs guidance. Please f/u with pt. Sadie Reynolds, ASA

## 2014-10-01 ENCOUNTER — Telehealth: Payer: Self-pay | Admitting: Family Medicine

## 2014-10-01 NOTE — Telephone Encounter (Signed)
Pt dropping off a referral form to be completed by the provider and faxed to [facility] fax number is [phone]. Be aware that the office notes for the last 3 visits are to be included with this form. Thank you,  Fonda Kinder, ASA

## 2014-10-01 NOTE — Telephone Encounter (Signed)
Please ask one of the attendings if patient can been seen by another provider for pain. Patient will require an OV for pain medication and it appears that my schedule will not accommodate this patient's needs.

## 2014-10-01 NOTE — Telephone Encounter (Signed)
Thank you for helping this patient with her medical needs.

## 2014-10-01 NOTE — Telephone Encounter (Signed)
Mrs. Debra Everett is here and has reiterated the message below. She has shown me the forms that she received from "[facility]. The appt time written on the form is on October 23, 2014 @ 8 AM. The rep names on the form are "Livia Snellen or Luellen Pucker" and one of them may be reached at [phone]. Pt was willing to make an appt to see her provider as recommended below. The only issue is that Dr. Lajuana Ripple is unavailable until Sept 28th. She is in quite the dilemma because she needs something to assist her with this pain, at least until her appt with this clinic. Sadie Reynolds, ASA

## 2014-10-01 NOTE — Telephone Encounter (Signed)
Pt informed by Debra Everett. Debra Everett, Debra Everett

## 2014-10-01 NOTE — Telephone Encounter (Signed)
Please verify appt date with pain management and advise patient to schedule an appt with me for pain.

## 2014-10-01 NOTE — Telephone Encounter (Signed)
Patient returned call asking about pain med refills.  Advised of need for appt per Dr. Lajuana Ripple.  Appt made for Tuesday 10/04/13 with Dr. Ardelia Mems (only attending present). [name], [name]

## 2014-10-05 ENCOUNTER — Ambulatory Visit (INDEPENDENT_AMBULATORY_CARE_PROVIDER_SITE_OTHER): Payer: Medicaid Other | Admitting: Family Medicine

## 2014-10-05 ENCOUNTER — Encounter: Payer: Self-pay | Admitting: Family Medicine

## 2014-10-05 VITALS — BP 128/92 | HR 105 | Temp 98.0°F | Ht 62.0 in | Wt 255.6 lb

## 2014-10-05 DIAGNOSIS — Z7189 Other specified counseling: Secondary | ICD-10-CM | POA: Diagnosis present

## 2014-10-05 DIAGNOSIS — G8929 Other chronic pain: Secondary | ICD-10-CM

## 2014-10-05 MED ORDER — OXYCODONE-ACETAMINOPHEN 10-325 MG PO TABS: 1.0000 | ORAL_TABLET | Freq: Three times a day (TID) | ORAL | Status: DC | PRN

## 2014-10-05 NOTE — Progress Notes (Signed)
Patient ID: Debra Everett, female   DOB: 04/08/1960, 54 y.o.   MRN: [id number]  HPI:  Chronic pain: pt with hx of rheumatoid arthritis, treated here for the last several months for chronic pain with percocet 10-325mg  TID, referred to pain management. Last seen here at the end of June. Reports having been out of her pain medicine for the last month and having lots of pain due to this. Has appointment with pain management clinic on 9/24 ([facility]) but requesting enough medicine to last until this visit. Reports previously was taking medicine four times a day instead of three. Denies having fever, saddle anesthesia, lower extremity weakness, or problems with stooling or urination. Has pain in back, knees, all over all joints of her body. She did bring in her intake paperwork with [facility], which documents her scheduled appt.  ROS: See HPI.  [place]: hx anxiety, chronic pain, depression, opioid use, rheumatoid arthritis  PHYSICAL EXAM: BP 128/92 mmHg  Pulse 105  Temp(Src) 98 F (36.7 C) (Oral)  Ht 5\' 2"  (1.575 m)  Wt 255 lb 9.6 oz (115.939 kg)  BMI 46.74 kg/m2  LMP 10/07/2013 Gen: NAD, appears in pain, no distress Back: back diffusely TTP Neuro:  Ambulates with walker, but some difficulty due to pain.  Psych: tearful on occasion, affect normal range, well groomed, normal eye contact  ASSESSMENT/PLAN:  Encounter for chronic pain management [place] Controlled Substance Database reviewed, findings are appropriate for patient's history as reported by her and documented in Epic.  Refill percocet 10-325mg  q8h prn #60 - enough to last until Pain Management Appt. Stressed to pt that this has to last until that visit, will not refill early. Only take 3 times daily.   FOLLOW UP: F/u as scheduled with pain management.  [place]. Ardelia Mems, [place]

## 2014-10-05 NOTE — Patient Instructions (Signed)
Nice to meet you today. Refilled pain medicine.  Do not take more than one pill 3 times per day Will not refill again before pain management appointment - this has to last until then  Be well, Dr. Ardelia Mems

## 2014-10-05 NOTE — Assessment & Plan Note (Signed)
[place] Controlled Substance Database reviewed, findings are appropriate for patient's history as reported by her and documented in Epic.  Refill percocet 10-325mg  q8h prn #60 - enough to last until Pain Management Appt. Stressed to pt that this has to last until that visit, will not refill early. Only take 3 times daily.

## 2014-10-06 NOTE — Telephone Encounter (Signed)
[name] is calling back to remind provider that paper to be completed need to be back to her by 9/24 no later.

## 2014-10-06 NOTE — Telephone Encounter (Signed)
Form placed in PCP box. Zimmerman Rumple, Jagar Lua D, CMA  

## 2014-10-07 NOTE — Telephone Encounter (Signed)
Thank you.  I will work on this tomorrow.

## 2014-10-08 NOTE — Telephone Encounter (Signed)
Form given to [facility], who will attach last 3 pain visits with form.  Of note, patient did not complete her portion of the form.  I am not sure how this will impact the referral.

## 2014-10-08 NOTE — Telephone Encounter (Signed)
Spoke to pt and explained that we can not fax the form, until she fills out her portion of the form.  I told her I would leave the form at the front desk for her to fill out. Pt stated she will be here Monday am to fill out her portion. Ottis Stain, CMA

## 2014-10-11 ENCOUNTER — Telehealth: Payer: Self-pay | Admitting: Family Medicine

## 2014-10-11 NOTE — Telephone Encounter (Signed)
Patient came to complete pain management form and requested to fax it. Please, follow up.

## 2014-10-18 DIAGNOSIS — M0579 Rheumatoid arthritis with rheumatoid factor of multiple sites without organ or systems involvement: Secondary | ICD-10-CM | POA: Diagnosis not present

## 2014-10-18 DIAGNOSIS — Z7952 Long term (current) use of systemic steroids: Secondary | ICD-10-CM | POA: Diagnosis not present

## 2014-10-18 DIAGNOSIS — R7989 Other specified abnormal findings of blood chemistry: Secondary | ICD-10-CM | POA: Diagnosis not present

## 2014-10-18 DIAGNOSIS — Z79899 Other long term (current) drug therapy: Secondary | ICD-10-CM | POA: Diagnosis not present

## 2014-10-21 NOTE — Telephone Encounter (Signed)
Pt called checking status to be sure this was taken care of. I looked in the previously faxed documents and her pain management form was faxed on 10/11/14.

## 2014-10-21 NOTE — Telephone Encounter (Signed)
See 10/01/14 phone note.

## 2014-10-21 NOTE — Telephone Encounter (Signed)
I completed her form with Nehemiah Settle about a week and a half ago.  Patient had not completed her section and was to come in to complete this so we could fax it to her pain specialist.  Please verify that she did this and that we faxed it.

## 2014-12-13 ENCOUNTER — Telehealth: Payer: Self-pay | Admitting: Family Medicine

## 2014-12-13 ENCOUNTER — Encounter: Payer: Self-pay | Admitting: Family Medicine

## 2014-12-13 NOTE — Telephone Encounter (Signed)
I will be happy to write a note with a list of patient's diagnoses and therapies.  If she is needing a letter for disability, she will have to seek out a disability provider.  We do not participate in disability exam.  Please advise patient.

## 2014-12-13 NOTE — Telephone Encounter (Signed)
Patient requesting a letter from Dr. Lajuana Ripple stating that she is unable to work. Patient would like to to pick this up by the end of the week. Patient has to turn the letter in by 12/18/14.

## 2014-12-15 NOTE — Telephone Encounter (Signed)
Pt called back and said the letter needs to say she can not work so she can get food stamps. Please let her know when she can pick this up. jw

## 2014-12-16 NOTE — Telephone Encounter (Signed)
Discussed with patient that our office does not complete disability physicals.  I will place a letter with current dx and therapies up front at patient's request.  She will seek out a disability provider for formal exam.

## 2014-12-16 NOTE — Telephone Encounter (Signed)
Will forward to Dr. Gottschalk. Debra Everett,CMA  

## 2015-02-23 DIAGNOSIS — F333 Major depressive disorder, recurrent, severe with psychotic symptoms: Secondary | ICD-10-CM | POA: Diagnosis not present

## 2015-03-04 DIAGNOSIS — I10 Essential (primary) hypertension: Secondary | ICD-10-CM | POA: Diagnosis not present

## 2015-03-04 DIAGNOSIS — G894 Chronic pain syndrome: Secondary | ICD-10-CM | POA: Diagnosis not present

## 2015-03-04 DIAGNOSIS — E669 Obesity, unspecified: Secondary | ICD-10-CM | POA: Diagnosis not present

## 2015-03-04 DIAGNOSIS — M1990 Unspecified osteoarthritis, unspecified site: Secondary | ICD-10-CM | POA: Diagnosis not present

## 2015-03-07 DIAGNOSIS — E663 Overweight: Secondary | ICD-10-CM | POA: Diagnosis not present

## 2015-03-07 DIAGNOSIS — M0579 Rheumatoid arthritis with rheumatoid factor of multiple sites without organ or systems involvement: Secondary | ICD-10-CM | POA: Diagnosis not present

## 2015-03-07 DIAGNOSIS — Z6841 Body Mass Index (BMI) 40.0 and over, adult: Secondary | ICD-10-CM | POA: Diagnosis not present

## 2015-03-07 DIAGNOSIS — Z87891 Personal history of nicotine dependence: Secondary | ICD-10-CM | POA: Diagnosis not present

## 2015-03-07 DIAGNOSIS — Z79899 Other long term (current) drug therapy: Secondary | ICD-10-CM | POA: Diagnosis not present

## 2015-03-07 DIAGNOSIS — Z7952 Long term (current) use of systemic steroids: Secondary | ICD-10-CM | POA: Diagnosis not present

## 2015-03-08 DIAGNOSIS — F333 Major depressive disorder, recurrent, severe with psychotic symptoms: Secondary | ICD-10-CM | POA: Diagnosis not present

## 2015-03-30 DIAGNOSIS — F333 Major depressive disorder, recurrent, severe with psychotic symptoms: Secondary | ICD-10-CM | POA: Diagnosis not present

## 2015-05-18 DIAGNOSIS — F333 Major depressive disorder, recurrent, severe with psychotic symptoms: Secondary | ICD-10-CM | POA: Diagnosis not present

## 2015-06-28 ENCOUNTER — Encounter (HOSPITAL_COMMUNITY): Payer: Self-pay | Admitting: Emergency Medicine

## 2015-06-28 ENCOUNTER — Emergency Department (HOSPITAL_COMMUNITY): Payer: Medicare Other

## 2015-06-28 ENCOUNTER — Emergency Department (HOSPITAL_COMMUNITY)
Admission: EM | Admit: 2015-06-28 | Discharge: 2015-06-28 | Disposition: A | Discharge status: Home / Self Care | Payer: Medicare Other | Attending: Emergency Medicine | Admitting: Emergency Medicine

## 2015-06-28 DIAGNOSIS — M549 Dorsalgia, unspecified: Secondary | ICD-10-CM | POA: Insufficient documentation

## 2015-06-28 DIAGNOSIS — D252 Subserosal leiomyoma of uterus: Secondary | ICD-10-CM | POA: Diagnosis not present

## 2015-06-28 DIAGNOSIS — Z7952 Long term (current) use of systemic steroids: Secondary | ICD-10-CM | POA: Diagnosis not present

## 2015-06-28 DIAGNOSIS — Z79899 Other long term (current) drug therapy: Secondary | ICD-10-CM | POA: Diagnosis not present

## 2015-06-28 DIAGNOSIS — Z87891 Personal history of nicotine dependence: Secondary | ICD-10-CM | POA: Insufficient documentation

## 2015-06-28 DIAGNOSIS — Z79891 Long term (current) use of opiate analgesic: Secondary | ICD-10-CM | POA: Diagnosis not present

## 2015-06-28 DIAGNOSIS — N7011 Chronic salpingitis: Secondary | ICD-10-CM | POA: Insufficient documentation

## 2015-06-28 DIAGNOSIS — R300 Dysuria: Secondary | ICD-10-CM | POA: Diagnosis present

## 2015-06-28 LAB — CBC WITH DIFFERENTIAL/PLATELET
Basophils Absolute: 0.1 10*3/uL (ref 0.0–0.1)
Basophils Relative: 1 %
Eosinophils Absolute: 0.1 10*3/uL (ref 0.0–0.7)
Eosinophils Relative: 2 %
HCT: 37.6 % (ref 36.0–46.0)
Hemoglobin: 13.6 g/dL (ref 12.0–15.0)
Lymphocytes Relative: 31 %
Lymphs Abs: 1.9 10*3/uL (ref 0.7–4.0)
MCH: 32.1 pg (ref 26.0–34.0)
MCHC: 36.2 g/dL — ABNORMAL HIGH (ref 30.0–36.0)
MCV: 88.7 fL (ref 78.0–100.0)
Monocytes Absolute: 0.5 10*3/uL (ref 0.1–1.0)
Monocytes Relative: 9 %
Neutro Abs: 3.5 10*3/uL (ref 1.7–7.7)
Neutrophils Relative %: 58 %
Platelets: 336 10*3/uL (ref 150–400)
RBC: 4.24 MIL/uL (ref 3.87–5.11)
RDW: 12.2 % (ref 11.5–15.5)
WBC: 6.1 10*3/uL (ref 4.0–10.5)

## 2015-06-28 LAB — COMPREHENSIVE METABOLIC PANEL
ALT: 36 U/L (ref 14–54)
AST: 33 U/L (ref 15–41)
Albumin: 4.1 g/dL (ref 3.5–5.0)
Alkaline Phosphatase: 69 U/L (ref 38–126)
Anion gap: 6 (ref 5–15)
BUN: 17 mg/dL (ref 6–20)
CO2: 24 mmol/L (ref 22–32)
Calcium: 9.9 mg/dL (ref 8.9–10.3)
Chloride: 106 mmol/L (ref 101–111)
Creatinine, Ser: 0.84 mg/dL (ref 0.44–1.00)
GFR calc Af Amer: >60 mL/min (ref >60)
GFR calc non Af Amer: >60 mL/min (ref >60)
Glucose, Bld: 94 mg/dL (ref 65–99)
Potassium: 3.8 mmol/L (ref 3.5–5.1)
Sodium: 136 mmol/L (ref 135–145)
Total Bilirubin: 1.2 mg/dL (ref 0.3–1.2)
Total Protein: 8 g/dL (ref 6.5–8.1)

## 2015-06-28 LAB — URINALYSIS, ROUTINE W REFLEX MICROSCOPIC
Bilirubin Urine: NEGATIVE
Glucose, UA: NEGATIVE mg/dL
Hgb urine dipstick: NEGATIVE
Ketones, ur: NEGATIVE mg/dL
Nitrite: NEGATIVE
Protein, ur: NEGATIVE mg/dL
Specific Gravity, Urine: 1.028 (ref 1.005–1.030)
pH: 5 (ref 5.0–8.0)

## 2015-06-28 LAB — URINE MICROSCOPIC-ADD ON

## 2015-06-28 LAB — CBG MONITORING, ED: Glucose-Capillary: 101 mg/dL — ABNORMAL HIGH (ref 65–99)

## 2015-06-28 MED ORDER — METRONIDAZOLE 500 MG PO TABS
2000.0000 mg | ORAL_TABLET | Freq: Once | ORAL | Status: AC
  Administered 2015-06-28: 2000 mg via ORAL
  Filled 2015-06-28: qty 4

## 2015-06-28 MED ORDER — HYDROCODONE-ACETAMINOPHEN 5-325 MG PO TABS: 1.0000 | ORAL_TABLET | Freq: Four times a day (QID) | ORAL | Status: DC | PRN

## 2015-06-28 MED ORDER — DOXYCYCLINE HYCLATE 100 MG PO CAPS: 100.0000 mg | ORAL_CAPSULE | Freq: Two times a day (BID) | ORAL | Status: DC

## 2015-06-28 MED ORDER — MORPHINE SULFATE (PF) 4 MG/ML IV SOLN
4.0000 mg | Freq: Once | INTRAVENOUS | Status: AC
  Administered 2015-06-28: 4 mg via INTRAVENOUS
  Filled 2015-06-28: qty 1

## 2015-06-28 MED ORDER — DIATRIZOATE MEGLUMINE & SODIUM 66-10 % PO SOLN: 15.0000 mL | ORAL | Status: DC | PRN

## 2015-06-28 MED ORDER — METRONIDAZOLE 500 MG PO TABS: 500.0000 mg | ORAL_TABLET | Freq: Two times a day (BID) | ORAL | Status: DC

## 2015-06-28 MED ORDER — SODIUM CHLORIDE 0.9 % IV BOLUS (SEPSIS)
1000.0000 mL | Freq: Once | INTRAVENOUS | Status: AC
  Administered 2015-06-28: 1000 mL via INTRAVENOUS

## 2015-06-28 MED ORDER — CEFTRIAXONE SODIUM 1 G IJ SOLR
1.0000 g | Freq: Once | INTRAMUSCULAR | Status: AC
  Administered 2015-06-28: 1 g via INTRAVENOUS
  Filled 2015-06-28: qty 10

## 2015-06-28 MED ORDER — IOPAMIDOL (ISOVUE-300) INJECTION 61%
100.0000 mL | Freq: Once | INTRAVENOUS | Status: AC | PRN
  Administered 2015-06-28: 100 mL via INTRAVENOUS

## 2015-06-28 MED ORDER — HYDROMORPHONE HCL 1 MG/ML IJ SOLN
1.0000 mg | Freq: Once | INTRAMUSCULAR | Status: AC
  Administered 2015-06-28: 1 mg via INTRAVENOUS
  Filled 2015-06-28: qty 1

## 2015-06-28 MED ORDER — CEPHALEXIN 500 MG PO CAPS: 1000.0000 mg | ORAL_CAPSULE | Freq: Two times a day (BID) | ORAL | Status: DC

## 2015-06-28 NOTE — ED Notes (Signed)
Pt ambulated to bathroom with little to no assistance with cane.

## 2015-06-28 NOTE — Discharge Instructions (Signed)
Return here as needed.  Follow-up with your GYN doctor.  The CT scan showed that you have some fluid noted in the fallopian tube on the left along with a ovarian cyst.

## 2015-06-28 NOTE — ED Notes (Signed)
Pt wheeled to husbands car.  Ambulatory and independent at discharge.

## 2015-06-28 NOTE — ED Notes (Signed)
Bed: WA16 Expected date:  Expected time:  Means of arrival:  Comments: Hold for triage 1 

## 2015-06-28 NOTE — ED Notes (Signed)
Pt continues to drink CT contrast without difficulty. Denies N/V

## 2015-06-28 NOTE — ED Provider Notes (Signed)
CSN: [id number]     Arrival date & time 06/28/15  1553 History   First MD Initiated Contact with Patient 06/28/15 1627     Chief Complaint  Patient presents with  . Dysuria  . Back Pain     (Consider location/radiation/quality/duration/timing/severity/associated sxs/prior Treatment) HPI Patient presents to the emergency department with abdominal discomfort that started 2 weeks ago.  The patient states that she has been having this discomfort for over 2 weeks.  She states that she has not had any vaginal bleeding or vaginal discharge.  The patient states that she has had some urinary frequency, urinary hesitancy over the last week.  Patient did not take any medications prior to arrival. The patient denies chest pain, shortness of breath, headache,blurred vision, neck pain, fever, cough, weakness, numbness, dizziness, anorexia, edema, abdominal pain, nausea, vomiting, diarrhea, rash, back pain, dysuria, hematemesis, bloody stool, near syncope, or syncope. Past Medical History  Diagnosis Date  . Headache(784.0)   . Anemia     as teenager  . Fibroid   . Anxiety   . Arthritis   . RA (rheumatoid arthritis) [facility])    Past Surgical History  Procedure Laterality Date  . Abdominal surgery    . Appendectomy     Family History  Problem Relation Age of Onset  . Breast cancer Sister     55s   Social History  Substance Use Topics  . Smoking status: Former Smoker -- 1.00 packs/day    Types: Cigarettes    Start date: 08/21/1980  . Smokeless tobacco: None  . Alcohol Use: No   OB History    Gravida Para Term Preterm AB TAB SAB Ectopic Multiple Living   3 3 3  0 0 0 0 0 0 2     Review of Systems All other systems negative except as documented in the HPI. All pertinent positives and negatives as reviewed in the HPI.  Allergies  Review of patient's allergies indicates no known allergies.  Home Medications   Prior to Admission medications   Medication Sig Start Date End Date Taking?  Authorizing Provider  Adalimumab 40 MG/0.8ML PNKT Inject 40 mg into the skin once a week. On Fridays. 03/07/15  Yes Historical Provider, MD  hydroxychloroquine (PLAQUENIL) 200 MG tablet Take 400 mg by mouth daily.   Yes Historical Provider, MD  lisinopril-hydrochlorothiazide (PRINZIDE,ZESTORETIC) 20-12.5 MG tablet Take 1 tablet by mouth daily.   Yes Historical Provider, MD  predniSONE (DELTASONE) 5 MG tablet Take 5 mg by mouth daily with breakfast.   Yes Historical Provider, MD  naproxen sodium (ANAPROX) 220 MG tablet Take 1 tablet (220 mg total) by mouth 2 (two) times daily with a meal. Patient not taking: Reported on 06/28/2015 08/25/14   Jola Schmidt, MD  oxyCODONE-acetaminophen (PERCOCET) 10-325 MG per tablet Take 1 tablet by mouth every 8 (eight) hours as needed for pain. Patient not taking: Reported on 06/28/2015 10/05/14   Leeanne Rio, MD   BP 109/81 mmHg  Pulse 74  Temp(Src) 97.9 F (36.6 C) (Oral)  Resp 18  SpO2 99%  LMP 10/07/2013 Physical Exam  Constitutional: She is oriented to person, place, and time. She appears well-developed and well-nourished. No distress.  HENT:  Head: Normocephalic and atraumatic.  Mouth/Throat: Oropharynx is clear and moist.  Eyes: Pupils are equal, round, and reactive to light.  Neck: Normal range of motion. Neck supple.  Cardiovascular: Normal rate, regular rhythm and normal heart sounds.  Exam reveals no gallop and no friction rub.  No murmur heard. Pulmonary/Chest: Effort normal and breath sounds normal. No respiratory distress. She has no wheezes.  Abdominal: Soft. Normal appearance and bowel sounds are normal. She exhibits no distension. There is tenderness. There is no rebound and no guarding.    Neurological: She is alert and oriented to person, place, and time. She exhibits normal muscle tone. Coordination normal.  Skin: Skin is warm and dry. No rash noted. No erythema.  Psychiatric: She has a normal mood and affect. Her behavior is  normal.  Nursing note and vitals reviewed.   ED Course  Procedures (including critical care time) Labs Review Labs Reviewed  URINALYSIS, ROUTINE W REFLEX MICROSCOPIC (NOT AT [facility]) - Abnormal; Notable for the following:    Leukocytes, UA TRACE (*)    All other components within normal limits  URINE MICROSCOPIC-ADD ON - Abnormal; Notable for the following:    Squamous Epithelial / LPF 0-5 (*)    Bacteria, UA FEW (*)    All other components within normal limits  CBC WITH DIFFERENTIAL/PLATELET - Abnormal; Notable for the following:    MCHC 36.2 (*)    All other components within normal limits  CBG MONITORING, ED - Abnormal; Notable for the following:    Glucose-Capillary 101 (*)    All other components within normal limits  URINE CULTURE  COMPREHENSIVE METABOLIC PANEL    Imaging Review Ct Abdomen Pelvis W Contrast  06/28/2015  CLINICAL DATA:  Urinary frequency, back pain x2 weeks. Prior appendectomy EXAM: CT ABDOMEN AND PELVIS WITH CONTRAST TECHNIQUE: Multidetector CT imaging of the abdomen and pelvis was performed using the standard protocol following bolus administration of intravenous contrast. CONTRAST:  177mL ISOVUE-300 IOPAMIDOL (ISOVUE-300) INJECTION 61% COMPARISON:  None. FINDINGS: Lower chest:  Lung bases are clear. Hepatobiliary: Hepatic steatosis with focal fatty sparing along the gallbladder fossa. Gallbladder is unremarkable. No intrahepatic or extrahepatic ductal dilatation. Pancreas: Within normal limits, noting mild prominence of the pancreatic duct, without associated mass or pancreatic atrophy. Spleen: Within normal limits. Adrenals/Urinary Tract: Adrenal glands are within normal limits. Kidneys are within normal limits.  No hydronephrosis. Bladder is within normal limits. Stomach/Bowel: Stomach is within normal limits. No evidence of bowel obstruction. Prior appendectomy. Left colonic diverticulosis, without evidence of diverticulitis. Vascular/Lymphatic: Atherosclerotic  calcifications of the abdominal aorta and branch vessels. No evidence of abdominal aortic aneurysm. No suspicious abdominopelvic lymphadenopathy. Reproductive: 5.8 cm subserosal right uterine body fibroid (series 2/image 76). 5.5 cm subserosal left fundal fibroid (series 2/image 35). Right ovary is within normal limits. Left ovary is notable for a 2.6 x 4.2 cm cyst/follicle (series 2/image 73) as well as a tubular lesion in the left adnexa (series 2/ image 68), possibly reflecting a hydrosalpinx. Other: No abdominopelvic ascites. Tiny fat containing bilateral inguinal hernias (series 2/ image 81). Musculoskeletal: Very mild degenerative changes of the visualized thoracolumbar spine. IMPRESSION: No evidence of bowel obstruction. Prior appendectomy. Left colonic diverticulosis, without evidence of diverticulitis. Uterine fibroids, measuring up to 5.8 cm. 4.2 cm left ovarian cyst/follicle. Additional fluid density tubular lesion in the left adnexa, favored to reflect a hydrosalpinx. Consider follow-up pelvic ultrasound in 6-12 weeks. Additional ancillary findings as above. Electronically Signed   By: Julian Hy M.D.   On: 06/28/2015 20:51   I have personally reviewed and evaluated these images and lab results as part of my medical decision-making.  The patient be treated for this hydrosalpinx.  I advised her she will need to follow-up with her GYN doctor.  The patient agrees to the  plan and all questions were answered.  The patient is advised to return here as needed.  She does have trichomonas noted in her urine.  The patient does have urinary symptoms  Dalia Heading, PA-C 06/28/15 2115  Charlesetta Shanks, MD 07/07/15 [phone]

## 2015-06-28 NOTE — ED Notes (Signed)
Pt arrived back from CT

## 2015-06-28 NOTE — ED Notes (Signed)
Per pt, states urinary frequency and back pain for 2 weeks-states also having hip pain

## 2015-06-30 LAB — URINE CULTURE: Culture: 1000 — AB

## 2015-07-11 DIAGNOSIS — F333 Major depressive disorder, recurrent, severe with psychotic symptoms: Secondary | ICD-10-CM | POA: Diagnosis not present

## 2015-07-13 DIAGNOSIS — M0579 Rheumatoid arthritis with rheumatoid factor of multiple sites without organ or systems involvement: Secondary | ICD-10-CM | POA: Diagnosis not present

## 2015-07-13 DIAGNOSIS — Z79899 Other long term (current) drug therapy: Secondary | ICD-10-CM | POA: Diagnosis not present

## 2015-07-13 DIAGNOSIS — E559 Vitamin D deficiency, unspecified: Secondary | ICD-10-CM | POA: Diagnosis not present

## 2015-07-13 DIAGNOSIS — Z7952 Long term (current) use of systemic steroids: Secondary | ICD-10-CM | POA: Diagnosis not present

## 2015-07-13 DIAGNOSIS — R7989 Other specified abnormal findings of blood chemistry: Secondary | ICD-10-CM | POA: Diagnosis not present

## 2015-07-13 DIAGNOSIS — Z87891 Personal history of nicotine dependence: Secondary | ICD-10-CM | POA: Diagnosis not present

## 2015-07-14 DIAGNOSIS — F333 Major depressive disorder, recurrent, severe with psychotic symptoms: Secondary | ICD-10-CM | POA: Diagnosis not present

## 2015-07-21 DIAGNOSIS — F333 Major depressive disorder, recurrent, severe with psychotic symptoms: Secondary | ICD-10-CM | POA: Diagnosis not present

## 2015-08-16 DIAGNOSIS — Z780 Asymptomatic menopausal state: Secondary | ICD-10-CM | POA: Diagnosis not present

## 2015-08-24 DIAGNOSIS — Z7952 Long term (current) use of systemic steroids: Secondary | ICD-10-CM | POA: Diagnosis not present

## 2015-08-24 DIAGNOSIS — M0579 Rheumatoid arthritis with rheumatoid factor of multiple sites without organ or systems involvement: Secondary | ICD-10-CM | POA: Diagnosis not present

## 2015-08-24 DIAGNOSIS — M0589 Other rheumatoid arthritis with rheumatoid factor of multiple sites: Secondary | ICD-10-CM | POA: Diagnosis not present

## 2015-08-24 DIAGNOSIS — Z79899 Other long term (current) drug therapy: Secondary | ICD-10-CM | POA: Diagnosis not present

## 2015-08-24 DIAGNOSIS — Z87891 Personal history of nicotine dependence: Secondary | ICD-10-CM | POA: Diagnosis not present

## 2015-09-01 DIAGNOSIS — F333 Major depressive disorder, recurrent, severe with psychotic symptoms: Secondary | ICD-10-CM | POA: Diagnosis not present

## 2015-09-07 DIAGNOSIS — E559 Vitamin D deficiency, unspecified: Secondary | ICD-10-CM | POA: Diagnosis not present

## 2015-09-07 DIAGNOSIS — Z79899 Other long term (current) drug therapy: Secondary | ICD-10-CM | POA: Diagnosis not present

## 2015-09-07 DIAGNOSIS — M0579 Rheumatoid arthritis with rheumatoid factor of multiple sites without organ or systems involvement: Secondary | ICD-10-CM | POA: Diagnosis not present

## 2015-09-12 ENCOUNTER — Encounter: Payer: Self-pay | Admitting: Obstetrics & Gynecology

## 2015-09-12 ENCOUNTER — Ambulatory Visit (INDEPENDENT_AMBULATORY_CARE_PROVIDER_SITE_OTHER): Payer: Medicare Other | Admitting: Obstetrics & Gynecology

## 2015-09-12 VITALS — BP 107/77 | HR 102 | Temp 98.1°F | Ht 62.0 in | Wt 250.4 lb

## 2015-09-12 DIAGNOSIS — Z124 Encounter for screening for malignant neoplasm of cervix: Secondary | ICD-10-CM | POA: Diagnosis not present

## 2015-09-12 DIAGNOSIS — R102 Pelvic and perineal pain: Secondary | ICD-10-CM | POA: Diagnosis not present

## 2015-09-12 DIAGNOSIS — N83209 Unspecified ovarian cyst, unspecified side: Secondary | ICD-10-CM

## 2015-09-12 DIAGNOSIS — Z01419 Encounter for gynecological examination (general) (routine) without abnormal findings: Secondary | ICD-10-CM

## 2015-09-12 DIAGNOSIS — Z1239 Encounter for other screening for malignant neoplasm of breast: Secondary | ICD-10-CM

## 2015-09-12 NOTE — Progress Notes (Signed)
Subjective:     Debra Everett is a 55 y.o. female here for a routine exam.  Current complaints: pt reports pain prev that sent her to the ED.  She was noted at the time to have an cyst on her ovary.  She was also treated with a UTI at the time.  Alll of her sx have resolved. Her LMP was 1 1/2 years prev   Gynecologic History Patient's last menstrual period was 10/07/2013. Contraception: post menopausal status Last Pap: 10/2011. Results were: abnormal ASCUS with neg hrHPV Last mammogram: unk. Results were: normal per pt  Obstetric History OB History  Gravida Para Term Preterm AB Living  3 3 3  0 0 2  SAB TAB Ectopic Multiple Live Births  0 0 0 0      # Outcome Date GA Lbr Len/2nd Weight Sex Delivery Anes PTL Lv  3 Term     F Vag-Spont     2 Term     M Vag-Spont        Birth Comments: breech  1 Term     M Vag-Spont         The following portions of the patient's history were reviewed and updated as appropriate: allergies, current medications, past family history, past medical history, past social history, past surgical history and problem list.  Review of Systems Pertinent items are noted in HPI.    Objective:   BP 107/77   Pulse (!) 102   Temp 98.1 F (36.7 C) (Oral)   Ht 5\' 2"  (1.575 m)   Wt 250 lb 6.4 oz (113.6 kg)   LMP 10/07/2013   BMI 45.80 kg/m  General Appearance:    Alert, cooperative, no distress, appears stated age  Head:    Normocephalic, without obvious abnormality, atraumatic  Eyes:    conjunctiva/corneas clear, EOM's intact, both eyes  Ears:    Normal external ear canals, both ears  Nose:   Nares normal, septum midline, mucosa normal, no drainage    or sinus tenderness  Throat:   Lips, mucosa, and tongue normal; teeth and gums normal  Neck:   Supple, symmetrical, trachea midline, no adenopathy;    thyroid:  no enlargement/tenderness/nodules  Back:     Symmetric, no curvature, ROM normal, no CVA tenderness  Lungs:     Clear to auscultation bilaterally,  respirations unlabored  Chest Wall:    No tenderness or deformity   Heart:    Regular rate and rhythm, S1 and S2 normal, no murmur, rub   or gallop  Breast Exam:    No tenderness, masses, or nipple abnormality  Abdomen:     Soft, non-tender, bowel sounds active all four quadrants,    no masses, no organomegaly  Genitalia:    Normal female without lesion, discharge or tenderness     Extremities:   Extremities normal, atraumatic, no cyanosis or edema  Pulses:   2+ and symmetric all extremities  Skin:   Skin color, texture, turgor normal, no rashes or lesions  06/15/2015  CLINICAL DATA:  Urinary frequency, back pain x2 weeks. Prior appendectomy  EXAM: CT ABDOMEN AND PELVIS WITH CONTRAST  TECHNIQUE: Multidetector CT imaging of the abdomen and pelvis was performed using the standard protocol following bolus administration of intravenous contrast.  CONTRAST:  162mL ISOVUE-300 IOPAMIDOL (ISOVUE-300) INJECTION 61%  COMPARISON:  None.  FINDINGS: Lower chest:  Lung bases are clear.  Hepatobiliary: Hepatic steatosis with focal fatty sparing along the gallbladder fossa.  Gallbladder is unremarkable. No  intrahepatic or extrahepatic ductal dilatation.  Pancreas: Within normal limits, noting mild prominence of the pancreatic duct, without associated mass or pancreatic atrophy.  Spleen: Within normal limits.  Adrenals/Urinary Tract: Adrenal glands are within normal limits.  Kidneys are within normal limits.  No hydronephrosis.  Bladder is within normal limits.  Stomach/Bowel: Stomach is within normal limits.  No evidence of bowel obstruction.  Prior appendectomy.  Left colonic diverticulosis, without evidence of diverticulitis.  Vascular/Lymphatic: Atherosclerotic calcifications of the abdominal aorta and branch vessels. No evidence of abdominal aortic aneurysm.  No suspicious abdominopelvic lymphadenopathy.  Reproductive: 5.8 cm subserosal right uterine  body fibroid (series 2/image 76). 5.5 cm subserosal left fundal fibroid (series 2/image 35).  Right ovary is within normal limits.  Left ovary is notable for a 2.6 x 4.2 cm cyst/follicle (series 2/image 73) as well as a tubular lesion in the left adnexa (series 2/ image 68), possibly reflecting a hydrosalpinx.  Other: No abdominopelvic ascites.  Tiny fat containing bilateral inguinal hernias (series 2/ image 81).  Musculoskeletal: Very mild degenerative changes of the visualized thoracolumbar spine.  IMPRESSION: No evidence of bowel obstruction. Prior appendectomy. Left colonic diverticulosis, without evidence of diverticulitis.  Uterine fibroids, measuring up to 5.8 cm.  4.2 cm left ovarian cyst/follicle. Additional fluid density tubular lesion in the left adnexa, favored to reflect a hydrosalpinx. Consider follow-up pelvic ultrasound in 6-12 weeks.  Additional ancillary findings as above.  Assessment:    Healthy female exam.   Prev pelvic pain- improved. Need [place] to check for resolution of ov cyst screening for high risk pt for breast CA 10/2011 Pt with h/o ASCUS PAP neg hrHPV no repeat done   Plan:    Mammogram ordered. Follow up in: 1 year.    Pelvic US- complete and TV F/u PAP with hrHPV screen  Kutter Schnepf L. Harraway-Smith, M.D., [name]

## 2015-09-15 DIAGNOSIS — F333 Major depressive disorder, recurrent, severe with psychotic symptoms: Secondary | ICD-10-CM | POA: Diagnosis not present

## 2015-09-15 LAB — IGP, APTIMA HPV, RFX 16/18,45: PAP Smear Comment: 0

## 2015-09-20 ENCOUNTER — Ambulatory Visit (HOSPITAL_COMMUNITY): Payer: Medicare Other

## 2015-09-27 ENCOUNTER — Ambulatory Visit (HOSPITAL_COMMUNITY)
Admission: RE | Admit: 2015-09-27 | Discharge: 2015-09-27 | Disposition: A | Discharge status: Home / Self Care | Payer: Medicare Other | Source: Ambulatory Visit | Attending: Obstetrics & Gynecology | Admitting: Obstetrics & Gynecology

## 2015-09-27 DIAGNOSIS — D259 Leiomyoma of uterus, unspecified: Secondary | ICD-10-CM | POA: Diagnosis not present

## 2015-09-27 DIAGNOSIS — N83209 Unspecified ovarian cyst, unspecified side: Secondary | ICD-10-CM

## 2015-09-27 DIAGNOSIS — R938 Abnormal findings on diagnostic imaging of other specified body structures: Secondary | ICD-10-CM | POA: Insufficient documentation

## 2015-09-27 DIAGNOSIS — N83202 Unspecified ovarian cyst, left side: Secondary | ICD-10-CM | POA: Diagnosis not present

## 2015-09-27 DIAGNOSIS — R143 Flatulence: Secondary | ICD-10-CM | POA: Insufficient documentation

## 2015-09-29 ENCOUNTER — Telehealth: Payer: Self-pay | Admitting: *Deleted

## 2015-09-29 NOTE — Telephone Encounter (Signed)
Explained to patient HPV testing was not done on her PAP smear and Dr Ihor Dow wanted to give her the option to repeat this test or wait until next year.Her PAP is otherwise negative.Explained to her what HPV was and she decided she will wait to repeat it next year.

## 2015-10-06 DIAGNOSIS — F333 Major depressive disorder, recurrent, severe with psychotic symptoms: Secondary | ICD-10-CM | POA: Diagnosis not present

## 2015-10-17 ENCOUNTER — Telehealth: Payer: Self-pay | Admitting: Obstetrics & Gynecology

## 2015-10-17 NOTE — Telephone Encounter (Signed)
TC to pt to review results of [place]. Pt denies problems. She denies pain at all.  She also reports that she got the info Re her HPV test not being done and she will f/u next year for PAP with HPV.  Pt was asked to call if she developed any adverse sx otherwise to f/u in 1 year.  All questions were answered.  Jerni Selmer L. Harraway-Smith, M.D., [name]

## 2015-10-24 DIAGNOSIS — E559 Vitamin D deficiency, unspecified: Secondary | ICD-10-CM | POA: Diagnosis not present

## 2015-10-24 DIAGNOSIS — Z79899 Other long term (current) drug therapy: Secondary | ICD-10-CM | POA: Diagnosis not present

## 2015-10-26 DIAGNOSIS — M069 Rheumatoid arthritis, unspecified: Secondary | ICD-10-CM | POA: Diagnosis not present

## 2015-10-26 DIAGNOSIS — Z7952 Long term (current) use of systemic steroids: Secondary | ICD-10-CM | POA: Diagnosis not present

## 2015-10-26 DIAGNOSIS — Z87891 Personal history of nicotine dependence: Secondary | ICD-10-CM | POA: Diagnosis not present

## 2015-10-26 DIAGNOSIS — Z79899 Other long term (current) drug therapy: Secondary | ICD-10-CM | POA: Diagnosis not present

## 2015-10-26 DIAGNOSIS — R21 Rash and other nonspecific skin eruption: Secondary | ICD-10-CM | POA: Diagnosis not present

## 2015-10-26 DIAGNOSIS — M0579 Rheumatoid arthritis with rheumatoid factor of multiple sites without organ or systems involvement: Secondary | ICD-10-CM | POA: Diagnosis not present

## 2015-10-27 DIAGNOSIS — F333 Major depressive disorder, recurrent, severe with psychotic symptoms: Secondary | ICD-10-CM | POA: Diagnosis not present

## 2015-10-29 ENCOUNTER — Other Ambulatory Visit: Payer: Self-pay | Admitting: Obstetrics & Gynecology

## 2015-10-29 DIAGNOSIS — Z1231 Encounter for screening mammogram for malignant neoplasm of breast: Secondary | ICD-10-CM

## 2015-11-08 ENCOUNTER — Ambulatory Visit
Admission: RE | Admit: 2015-11-08 | Discharge: 2015-11-08 | Disposition: A | Discharge status: Home / Self Care | Payer: Medicare Other | Source: Ambulatory Visit | Attending: Obstetrics & Gynecology | Admitting: Obstetrics & Gynecology

## 2015-11-08 DIAGNOSIS — Z1231 Encounter for screening mammogram for malignant neoplasm of breast: Secondary | ICD-10-CM

## 2015-11-17 DIAGNOSIS — F333 Major depressive disorder, recurrent, severe with psychotic symptoms: Secondary | ICD-10-CM | POA: Diagnosis not present

## 2015-11-21 DIAGNOSIS — Z79899 Other long term (current) drug therapy: Secondary | ICD-10-CM | POA: Diagnosis not present

## 2015-11-21 DIAGNOSIS — M0579 Rheumatoid arthritis with rheumatoid factor of multiple sites without organ or systems involvement: Secondary | ICD-10-CM | POA: Diagnosis not present

## 2015-12-15 DIAGNOSIS — F333 Major depressive disorder, recurrent, severe with psychotic symptoms: Secondary | ICD-10-CM | POA: Diagnosis not present

## 2016-02-09 DIAGNOSIS — F333 Major depressive disorder, recurrent, severe with psychotic symptoms: Secondary | ICD-10-CM | POA: Diagnosis not present

## 2016-02-27 DIAGNOSIS — Z79899 Other long term (current) drug therapy: Secondary | ICD-10-CM | POA: Diagnosis not present

## 2016-02-27 DIAGNOSIS — M0579 Rheumatoid arthritis with rheumatoid factor of multiple sites without organ or systems involvement: Secondary | ICD-10-CM | POA: Diagnosis not present

## 2016-03-01 DIAGNOSIS — H04123 Dry eye syndrome of bilateral lacrimal glands: Secondary | ICD-10-CM | POA: Diagnosis not present

## 2016-03-01 DIAGNOSIS — H5213 Myopia, bilateral: Secondary | ICD-10-CM | POA: Diagnosis not present

## 2016-03-01 DIAGNOSIS — Z79899 Other long term (current) drug therapy: Secondary | ICD-10-CM | POA: Diagnosis not present

## 2016-03-01 DIAGNOSIS — H52203 Unspecified astigmatism, bilateral: Secondary | ICD-10-CM | POA: Diagnosis not present

## 2016-03-01 DIAGNOSIS — H524 Presbyopia: Secondary | ICD-10-CM | POA: Diagnosis not present

## 2016-03-08 DIAGNOSIS — F333 Major depressive disorder, recurrent, severe with psychotic symptoms: Secondary | ICD-10-CM | POA: Diagnosis not present

## 2016-03-15 DIAGNOSIS — F333 Major depressive disorder, recurrent, severe with psychotic symptoms: Secondary | ICD-10-CM | POA: Diagnosis not present

## 2016-03-27 ENCOUNTER — Emergency Department (HOSPITAL_COMMUNITY)
Admission: EM | Admit: 2016-03-27 | Discharge: 2016-03-28 | Disposition: A | Discharge status: Home / Self Care | Payer: Medicare Other | Attending: Emergency Medicine | Admitting: Emergency Medicine

## 2016-03-27 ENCOUNTER — Encounter (HOSPITAL_COMMUNITY): Payer: Self-pay | Admitting: *Deleted

## 2016-03-27 DIAGNOSIS — K529 Noninfective gastroenteritis and colitis, unspecified: Secondary | ICD-10-CM

## 2016-03-27 DIAGNOSIS — J069 Acute upper respiratory infection, unspecified: Secondary | ICD-10-CM | POA: Insufficient documentation

## 2016-03-27 DIAGNOSIS — Z87891 Personal history of nicotine dependence: Secondary | ICD-10-CM | POA: Diagnosis not present

## 2016-03-27 DIAGNOSIS — J111 Influenza due to unidentified influenza virus with other respiratory manifestations: Secondary | ICD-10-CM | POA: Diagnosis not present

## 2016-03-27 DIAGNOSIS — R110 Nausea: Secondary | ICD-10-CM

## 2016-03-27 DIAGNOSIS — R197 Diarrhea, unspecified: Secondary | ICD-10-CM | POA: Diagnosis present

## 2016-03-27 DIAGNOSIS — B349 Viral infection, unspecified: Secondary | ICD-10-CM | POA: Insufficient documentation

## 2016-03-27 DIAGNOSIS — R52 Pain, unspecified: Secondary | ICD-10-CM

## 2016-03-27 DIAGNOSIS — R059 Cough, unspecified: Secondary | ICD-10-CM

## 2016-03-27 DIAGNOSIS — R6889 Other general symptoms and signs: Secondary | ICD-10-CM

## 2016-03-27 DIAGNOSIS — M791 Myalgia: Secondary | ICD-10-CM | POA: Diagnosis not present

## 2016-03-27 DIAGNOSIS — R05 Cough: Secondary | ICD-10-CM

## 2016-03-27 LAB — BASIC METABOLIC PANEL
Anion gap: 8 (ref 5–15)
BUN: 13 mg/dL (ref 6–20)
CO2: 25 mmol/L (ref 22–32)
Calcium: 10.7 mg/dL — ABNORMAL HIGH (ref 8.9–10.3)
Chloride: 105 mmol/L (ref 101–111)
Creatinine, Ser: 0.7 mg/dL (ref 0.44–1.00)
GFR calc Af Amer: >60 mL/min (ref >60)
GFR calc non Af Amer: >60 mL/min (ref >60)
Glucose, Bld: 91 mg/dL (ref 65–99)
Potassium: 3.5 mmol/L (ref 3.5–5.1)
Sodium: 138 mmol/L (ref 135–145)

## 2016-03-27 LAB — CBC
HCT: 38.3 % (ref 36.0–46.0)
Hemoglobin: 14 g/dL (ref 12.0–15.0)
MCH: 32.3 pg (ref 26.0–34.0)
MCHC: 36.6 g/dL — ABNORMAL HIGH (ref 30.0–36.0)
MCV: 88.5 fL (ref 78.0–100.0)
Platelets: 295 10*3/uL (ref 150–400)
RBC: 4.33 MIL/uL (ref 3.87–5.11)
RDW: 12 % (ref 11.5–15.5)
WBC: 5.9 10*3/uL (ref 4.0–10.5)

## 2016-03-27 MED ORDER — ONDANSETRON 8 MG PO TBDP
8.0000 mg | ORAL_TABLET | Freq: Once | ORAL | Status: DC
  Filled 2016-03-27 (×2): qty 1

## 2016-03-27 MED ORDER — KETOROLAC TROMETHAMINE 30 MG/ML IJ SOLN
30.0000 mg | Freq: Once | INTRAMUSCULAR | Status: AC
  Administered 2016-03-27: 30 mg via INTRAMUSCULAR
  Filled 2016-03-27 (×2): qty 1

## 2016-03-27 MED ORDER — ONDANSETRON 4 MG PO TBDP: 4.0000 mg | ORAL_TABLET | Freq: Three times a day (TID) | ORAL | 0 refills | Status: DC | PRN

## 2016-03-27 MED ORDER — OXYCODONE HCL 5 MG PO TABS
15.0000 mg | ORAL_TABLET | Freq: Once | ORAL | Status: AC
  Administered 2016-03-27: 15 mg via ORAL
  Filled 2016-03-27: qty 3

## 2016-03-27 NOTE — ED Triage Notes (Addendum)
Pt reports gen body aches with low back and bila hip pain pain with weakness and cold congestion x 2 days.  Pt reports her back and hip pain is coming from her RA, she ran out of her meds x 2 days.  She has an appt with her doctor Friday.  Pt also reports "runny nose."  Pt also reports diarrhea today.

## 2016-03-27 NOTE — ED Provider Notes (Signed)
[facility] DEPT Provider Note   CSN: [id number] Arrival date & time: 03/27/16  [id number]  By signing my name below, I, Reola Mosher, attest that this documentation has been prepared under the direction and in the presence of [address] [employer]. Electronically Signed: Reola Mosher, ED Scribe. 03/27/16. 10:45 PM.  History   Chief Complaint Chief Complaint  Patient presents with  . Generalized Body Aches  . Weakness   The history is provided by the patient and medical records. No language interpreter was used.  Influenza  Presenting symptoms: cough (dry), diarrhea, fever (Tmax 100.2 ), headache, myalgias (body aches), nausea and rhinorrhea   Presenting symptoms: no shortness of breath, no sore throat and no vomiting   Severity:  Moderate Onset quality:  Gradual Duration:  2 days Progression:  Worsening Chronicity:  New Relieved by:  OTC medications Worsened by:  Nothing Ineffective treatments:  OTC medications Associated symptoms: no chills and no ear pain   Risk factors: immunocompromised state (on Humira)   Risk factors: no sick contacts     HPI Comments: Debra Everett is a 56 y.o. female with a PMHx of headaches, chronic back and hip pain, and RA, who presents to the Emergency Department with complaints of persistent, worsening flu-like symptoms consisting of body aches, headache, diarrhea, rhinorrhea, congestion, dry cough, and nausea beginning two days ago. Pt also reports that she had a fever of 100.2 degrees yesterday. She quantifies her episodes of watery, non-bloody diarrhea at five times a day. She has been taking Nyquil AM&PM with only mild relief of her symptoms. No known aggravating factors. No known sick contacts with similar symptoms. No recent public transport usage or travel. She denies sore throat, wheezing, ear pain/drainage, chills, CP, SOB, abd pain, vomiting, constipation, melena, hematochezia, hematuria, dysuria, numbness, tingling, focal  weakness, or any other complaints at this time.   Past Medical History:  Diagnosis Date  . Anemia    as teenager  . Anxiety   . Arthritis   . Fibroid   . Headache(784.0)   . RA (rheumatoid arthritis) [facility])    Patient Active Problem List   Diagnosis Date Noted  . Preventative health care 06/26/2014  . Encounter for chronic pain management 04/09/2014  . Rheumatoid factor positive 01/07/2014  . Polyarthralgia 01/06/2014  . Left leg pain 07/07/2012  . Depression 12/18/2011  . Tobacco abuse 12/18/2011  . Menorrhagia 12/06/2011  . Fibroid 11/19/2011  . Anxiety 11/19/2011  . Chronic pain 11/19/2011   Past Surgical History:  Procedure Laterality Date  . ABDOMINAL SURGERY    . APPENDECTOMY     OB History    Gravida Para Term Preterm AB Living   3 3 3  0 0 2   SAB TAB Ectopic Multiple Live Births   0 0 0 0       Home Medications    Prior to Admission medications   Medication Sig Start Date End Date Taking? Authorizing Provider  Adalimumab 40 MG/0.8ML PNKT Inject 40 mg into the skin once a week. On Fridays. 03/07/15  Yes Historical Provider, MD  hydroxychloroquine (PLAQUENIL) 200 MG tablet Take 400 mg by mouth daily.   Yes Historical Provider, MD  lisinopril-hydrochlorothiazide (PRINZIDE,ZESTORETIC) 20-12.5 MG tablet Take 1 tablet by mouth daily.   Yes Historical Provider, MD  lurasidone (LATUDA) 40 MG TABS tablet Take 40 mg by mouth daily with breakfast.   Yes Historical Provider, MD  oxyCODONE (ROXICODONE) 15 MG immediate release tablet Take 15 mg by mouth every  4 (four) hours as needed for pain.   Yes Historical Provider, MD   Family History Family History  Problem Relation Age of Onset  . Breast cancer Sister     2s   Social History Social History  Substance Use Topics  . Smoking status: Former Smoker    Packs/day: 1.00    Types: Cigarettes    Start date: 08/21/1980  . Smokeless tobacco: Former Systems developer  . Alcohol use No   Allergies   Patient has no known  allergies.  Review of Systems Review of Systems  Constitutional: Positive for fever (Tmax 100.2 ). Negative for chills.  HENT: Positive for rhinorrhea. Negative for ear discharge, ear pain and sore throat.   Respiratory: Positive for cough (dry). Negative for shortness of breath and wheezing.   Cardiovascular: Negative for chest pain.  Gastrointestinal: Positive for diarrhea and nausea. Negative for abdominal pain, blood in stool, constipation and vomiting.  Genitourinary: Negative for dysuria and hematuria.  Musculoskeletal: Positive for myalgias (body aches). Negative for arthralgias.  Skin: Negative for color change.  Allergic/Immunologic: Positive for immunocompromised state (on Humira).  Neurological: Positive for weakness and headaches. Negative for numbness.  Psychiatric/Behavioral: Negative for confusion.   A complete 10 system review of systems was obtained and all systems are negative except as noted in the HPI and PMH.   Physical Exam Updated Vital Signs BP 114/80 (BP Location: Right Arm)   Pulse 85   Temp 98.8 F (37.1 C) (Oral)   Resp 16   Ht 5\' 2"  (1.575 m)   Wt 235 lb (106.6 kg)   LMP 10/07/2013   SpO2 95%   BMI 42.98 kg/m   Physical Exam  Constitutional: She is oriented to person, place, and time. Vital signs are normal. She appears well-developed and well-nourished.  Non-toxic appearance. No distress.  Afebrile, nontoxic, NAD  HENT:  Head: Normocephalic and atraumatic.  Nose: Mucosal edema and rhinorrhea present.  Mouth/Throat: Uvula is midline, oropharynx is clear and moist and mucous membranes are normal. No trismus in the jaw. No uvula swelling. Tonsils are 0 on the right. Tonsils are 0 on the left. No tonsillar exudate.  Nose with mild mucosal edema and rhinorrhea. Oropharynx clear and moist, without uvular swelling or deviation, no trismus or drooling, no tonsillar swelling or erythema, no exudates.   Eyes: Conjunctivae and EOM are normal. Right eye  exhibits no discharge. Left eye exhibits no discharge.  Neck: Normal range of motion. Neck supple.  Cardiovascular: Normal rate, regular rhythm, normal heart sounds and intact distal pulses.  Exam reveals no gallop and no friction rub.   No murmur heard. Pulmonary/Chest: Effort normal and breath sounds normal. No respiratory distress. She has no decreased breath sounds. She has no wheezes. She has no rhonchi. She has no rales.  CTAB in all lung fields, no w/r/r, no hypoxia or increased WOB, speaking in full sentences, SpO2 95% on RA  Abdominal: Soft. Normal appearance and bowel sounds are normal. She exhibits no distension. There is no tenderness. There is no rigidity, no rebound, no guarding, no CVA tenderness, no tenderness at McBurney's point and negative Murphy's sign.  Soft, NTND, +BS throughout, no r/g/r, neg murphy's, neg mcburney's, no CVA TTP  Musculoskeletal: Normal range of motion.  MAE x4 Strength and sensation grossly intact in all extremities Distal pulses intact Gait steady  Neurological: She is alert and oriented to person, place, and time. She has normal strength. No sensory deficit.  Skin: Skin is warm, dry and  intact. No rash noted.  Psychiatric: She has a normal mood and affect.  Nursing note and vitals reviewed.  ED Treatments / Results  DIAGNOSTIC STUDIES: Oxygen Saturation is 95% on RA, adequate by my interpretation.   COORDINATION OF CARE: 10:43 PM-Discussed next steps with pt including antiemetics and Toradol for pain. Pt verbalized understanding and is agreeable with the plan.   Labs (all labs ordered are listed, but only abnormal results are displayed) Labs Reviewed  BASIC METABOLIC PANEL - Abnormal; Notable for the following:       Result Value   Calcium 10.7 (*)    All other components within normal limits  CBC - Abnormal; Notable for the following:    MCHC 36.6 (*)    All other components within normal limits   EKG  EKG Interpretation None       Radiology No results found.  Procedures Procedures   Medications Ordered in ED Medications  ketorolac (TORADOL) 30 MG/ML injection 30 mg (not administered)  ondansetron (ZOFRAN-ODT) disintegrating tablet 8 mg (not administered)  oxyCODONE (Oxy IR/ROXICODONE) immediate release tablet 15 mg (not administered)    Initial Impression / Assessment and Plan / ED Course  I have reviewed the triage vital signs and the nursing notes.  Pertinent labs & imaging results that were available during my care of the patient were reviewed by me and considered in my medical decision making (see chart for details).     56 y.o. female here with body aches, flu like symptoms x2 days, along with nausea and diarrhea. On exam, mild nasal congestion and rhinorrhea, clear lung exam, no abdominal tenderness. Labs unremarkable. Likely viral illness, possibly flu, however outside window of tamiflu benefit time frame. Doubt need for further emergent work up, will give toradol and zofran then as long as she can tolerate PO then will d/c home with rx for zofran. Also advised OTC meds for symptomatic control, BRAT diet. F/up with PCP in 2 days for her already scheduled visit. Will reassess after PO challenge completed.   11:29 PM Pt upset due to the fact that she's not receiving dilaudid, refusing to take anything here because she is requesting dilaudid for her chronic back and hip pain. Discussed that she will need to f/up with her PCP for her chronic pain management. After long discussion, she is willing to take her PO oxycodone 15mg  that she usually takes at home (last dose was Sunday since she ran out) and then she would be willing to take the toradol and zofran here. She is tolerating PO well. Will d/c home with previously outlined plan; advised that she must see her PCP for ongoing management of her chronic pain, and for further narcotic meds. I explained the diagnosis and have given explicit precautions to return to  the ER including for any other new or worsening symptoms. The patient understands and accepts the medical plan as it's been dictated and I have answered their questions. Discharge instructions concerning home care and prescriptions have been given. The patient is STABLE and is discharged to home in good condition.   I personally performed the services described in this documentation, which was scribed in my presence. The recorded information has been reviewed and is accurate.   Final Clinical Impressions(s) / ED Diagnoses   Final diagnoses:  Flu-like symptoms  Viral syndrome  Nausea  Diarrhea, unspecified type  Cough  Gastroenteritis  Body aches  Viral upper respiratory tract infection   New Prescriptions New Prescriptions   ONDANSETRON (  ZOFRAN ODT) 4 MG DISINTEGRATING TABLET    Take 1 tablet (4 mg total) by mouth every 8 (eight) hours as needed for nausea or vomiting.      [address] Garin Mata, PA-C [id number]    Delora Fuel, MD [id number]

## 2016-03-27 NOTE — ED Notes (Signed)
Pt refused Toradol and Zofran-ODT. Pt states she told provider she is not nauseated so she does not see the need for Zofran. Pt also stated that "every time I come to the hospital they give me dilaudid. I have severe chronic back pain and I know that's the only thing that will work for me."  Informed provider of issue and provider will be following up with pt shortly.

## 2016-03-27 NOTE — Discharge Instructions (Signed)
Continue to stay well-hydrated. Use zofran as prescribed, as needed for nausea. Continue to alternate between Tylenol and Ibuprofen for pain or fever. Use Mucinex for cough suppression/expectoration of mucus. Use netipot and flonase to help with nasal congestion. May consider over-the-counter Benadryl or other antihistamine to decrease secretions and for help with your symptoms.  Stay well hydrated with small sips of fluids throughout the day. Follow a BRAT (banana-rice-applesauce-toast) diet as described below for the next 24-48 hours. The 'BRAT' diet is suggested, then progress to diet as tolerated as symptoms abate. Call your regular doctor if bloody stools, persistent diarrhea, vomiting, fever or abdominal pain. Follow up with your regular doctor in the next week at your already scheduled appointment for recheck of symptoms. Return to ER for changing or worsening of symptoms.

## 2016-04-05 DIAGNOSIS — F333 Major depressive disorder, recurrent, severe with psychotic symptoms: Secondary | ICD-10-CM | POA: Diagnosis not present

## 2016-04-12 DIAGNOSIS — F333 Major depressive disorder, recurrent, severe with psychotic symptoms: Secondary | ICD-10-CM | POA: Diagnosis not present

## 2016-04-16 DIAGNOSIS — Z7952 Long term (current) use of systemic steroids: Secondary | ICD-10-CM | POA: Diagnosis not present

## 2016-04-16 DIAGNOSIS — K5903 Drug induced constipation: Secondary | ICD-10-CM | POA: Diagnosis not present

## 2016-04-16 DIAGNOSIS — Z79899 Other long term (current) drug therapy: Secondary | ICD-10-CM | POA: Diagnosis not present

## 2016-04-16 DIAGNOSIS — M1990 Unspecified osteoarthritis, unspecified site: Secondary | ICD-10-CM | POA: Diagnosis not present

## 2016-04-16 DIAGNOSIS — K5900 Constipation, unspecified: Secondary | ICD-10-CM | POA: Diagnosis not present

## 2016-04-16 DIAGNOSIS — M0579 Rheumatoid arthritis with rheumatoid factor of multiple sites without organ or systems involvement: Secondary | ICD-10-CM | POA: Diagnosis not present

## 2016-05-10 DIAGNOSIS — F333 Major depressive disorder, recurrent, severe with psychotic symptoms: Secondary | ICD-10-CM | POA: Diagnosis not present

## 2016-05-17 DIAGNOSIS — F333 Major depressive disorder, recurrent, severe with psychotic symptoms: Secondary | ICD-10-CM | POA: Diagnosis not present

## 2016-05-21 DIAGNOSIS — F333 Major depressive disorder, recurrent, severe with psychotic symptoms: Secondary | ICD-10-CM | POA: Diagnosis not present

## 2016-06-14 DIAGNOSIS — F333 Major depressive disorder, recurrent, severe with psychotic symptoms: Secondary | ICD-10-CM | POA: Diagnosis not present

## 2016-06-15 DIAGNOSIS — Z79899 Other long term (current) drug therapy: Secondary | ICD-10-CM | POA: Diagnosis not present

## 2016-06-15 DIAGNOSIS — M0579 Rheumatoid arthritis with rheumatoid factor of multiple sites without organ or systems involvement: Secondary | ICD-10-CM | POA: Diagnosis not present

## 2016-06-21 DIAGNOSIS — F333 Major depressive disorder, recurrent, severe with psychotic symptoms: Secondary | ICD-10-CM | POA: Diagnosis not present

## 2016-06-28 DIAGNOSIS — F333 Major depressive disorder, recurrent, severe with psychotic symptoms: Secondary | ICD-10-CM | POA: Diagnosis not present

## 2016-07-05 DIAGNOSIS — F333 Major depressive disorder, recurrent, severe with psychotic symptoms: Secondary | ICD-10-CM | POA: Diagnosis not present

## 2016-07-12 DIAGNOSIS — F333 Major depressive disorder, recurrent, severe with psychotic symptoms: Secondary | ICD-10-CM | POA: Diagnosis not present

## 2016-08-16 DIAGNOSIS — M0589 Other rheumatoid arthritis with rheumatoid factor of multiple sites: Secondary | ICD-10-CM | POA: Diagnosis not present

## 2016-08-16 DIAGNOSIS — F333 Major depressive disorder, recurrent, severe with psychotic symptoms: Secondary | ICD-10-CM | POA: Diagnosis not present

## 2016-08-16 DIAGNOSIS — Z87891 Personal history of nicotine dependence: Secondary | ICD-10-CM | POA: Diagnosis not present

## 2016-08-16 DIAGNOSIS — M0579 Rheumatoid arthritis with rheumatoid factor of multiple sites without organ or systems involvement: Secondary | ICD-10-CM | POA: Diagnosis not present

## 2016-08-16 DIAGNOSIS — Z79899 Other long term (current) drug therapy: Secondary | ICD-10-CM | POA: Diagnosis not present

## 2016-08-16 DIAGNOSIS — Z7952 Long term (current) use of systemic steroids: Secondary | ICD-10-CM | POA: Diagnosis not present

## 2016-08-21 DIAGNOSIS — F333 Major depressive disorder, recurrent, severe with psychotic symptoms: Secondary | ICD-10-CM | POA: Diagnosis not present

## 2016-08-23 DIAGNOSIS — F333 Major depressive disorder, recurrent, severe with psychotic symptoms: Secondary | ICD-10-CM | POA: Diagnosis not present

## 2016-09-03 DIAGNOSIS — Z79899 Other long term (current) drug therapy: Secondary | ICD-10-CM | POA: Diagnosis not present

## 2016-09-03 DIAGNOSIS — M0579 Rheumatoid arthritis with rheumatoid factor of multiple sites without organ or systems involvement: Secondary | ICD-10-CM | POA: Diagnosis not present

## 2016-09-13 DIAGNOSIS — F333 Major depressive disorder, recurrent, severe with psychotic symptoms: Secondary | ICD-10-CM | POA: Diagnosis not present

## 2016-09-20 DIAGNOSIS — F333 Major depressive disorder, recurrent, severe with psychotic symptoms: Secondary | ICD-10-CM | POA: Diagnosis not present

## 2016-09-21 DIAGNOSIS — G890 Central pain syndrome: Secondary | ICD-10-CM | POA: Diagnosis not present

## 2016-09-21 DIAGNOSIS — F419 Anxiety disorder, unspecified: Secondary | ICD-10-CM | POA: Diagnosis not present

## 2016-10-04 DIAGNOSIS — F333 Major depressive disorder, recurrent, severe with psychotic symptoms: Secondary | ICD-10-CM | POA: Diagnosis not present

## 2016-10-18 DIAGNOSIS — F333 Major depressive disorder, recurrent, severe with psychotic symptoms: Secondary | ICD-10-CM | POA: Diagnosis not present

## 2016-10-30 ENCOUNTER — Other Ambulatory Visit: Payer: Self-pay | Admitting: Obstetrics & Gynecology

## 2016-10-30 DIAGNOSIS — Z1231 Encounter for screening mammogram for malignant neoplasm of breast: Secondary | ICD-10-CM

## 2016-11-08 ENCOUNTER — Ambulatory Visit: Payer: Medicare Other

## 2016-11-19 DIAGNOSIS — E785 Hyperlipidemia, unspecified: Secondary | ICD-10-CM | POA: Diagnosis not present

## 2016-11-19 DIAGNOSIS — J069 Acute upper respiratory infection, unspecified: Secondary | ICD-10-CM | POA: Diagnosis not present

## 2016-11-19 DIAGNOSIS — M549 Dorsalgia, unspecified: Secondary | ICD-10-CM | POA: Diagnosis not present

## 2016-11-19 DIAGNOSIS — Z0000 Encounter for general adult medical examination without abnormal findings: Secondary | ICD-10-CM | POA: Diagnosis not present

## 2016-11-22 DIAGNOSIS — F333 Major depressive disorder, recurrent, severe with psychotic symptoms: Secondary | ICD-10-CM | POA: Diagnosis not present

## 2016-11-26 DIAGNOSIS — F333 Major depressive disorder, recurrent, severe with psychotic symptoms: Secondary | ICD-10-CM | POA: Diagnosis not present

## 2016-11-27 ENCOUNTER — Ambulatory Visit
Admission: RE | Admit: 2016-11-27 | Discharge: 2016-11-27 | Disposition: A | Discharge status: Home / Self Care | Payer: Medicare Other | Source: Ambulatory Visit | Attending: Obstetrics & Gynecology | Admitting: Obstetrics & Gynecology

## 2016-11-27 DIAGNOSIS — Z1231 Encounter for screening mammogram for malignant neoplasm of breast: Secondary | ICD-10-CM

## 2016-12-05 DIAGNOSIS — M0579 Rheumatoid arthritis with rheumatoid factor of multiple sites without organ or systems involvement: Secondary | ICD-10-CM | POA: Diagnosis not present

## 2016-12-05 DIAGNOSIS — Z5181 Encounter for therapeutic drug level monitoring: Secondary | ICD-10-CM | POA: Diagnosis not present

## 2016-12-05 DIAGNOSIS — Z79899 Other long term (current) drug therapy: Secondary | ICD-10-CM | POA: Diagnosis not present

## 2016-12-28 DIAGNOSIS — M549 Dorsalgia, unspecified: Secondary | ICD-10-CM | POA: Diagnosis not present

## 2016-12-28 DIAGNOSIS — J069 Acute upper respiratory infection, unspecified: Secondary | ICD-10-CM | POA: Diagnosis not present

## 2016-12-28 DIAGNOSIS — E785 Hyperlipidemia, unspecified: Secondary | ICD-10-CM | POA: Diagnosis not present

## 2017-01-02 DIAGNOSIS — Z79899 Other long term (current) drug therapy: Secondary | ICD-10-CM | POA: Diagnosis not present

## 2017-01-02 DIAGNOSIS — M0579 Rheumatoid arthritis with rheumatoid factor of multiple sites without organ or systems involvement: Secondary | ICD-10-CM | POA: Diagnosis not present

## 2017-02-04 DIAGNOSIS — M0579 Rheumatoid arthritis with rheumatoid factor of multiple sites without organ or systems involvement: Secondary | ICD-10-CM | POA: Diagnosis not present

## 2017-02-04 DIAGNOSIS — Z79899 Other long term (current) drug therapy: Secondary | ICD-10-CM | POA: Diagnosis not present

## 2017-02-07 DIAGNOSIS — F333 Major depressive disorder, recurrent, severe with psychotic symptoms: Secondary | ICD-10-CM | POA: Diagnosis not present

## 2017-02-14 DIAGNOSIS — F333 Major depressive disorder, recurrent, severe with psychotic symptoms: Secondary | ICD-10-CM | POA: Diagnosis not present

## 2017-02-21 DIAGNOSIS — M0579 Rheumatoid arthritis with rheumatoid factor of multiple sites without organ or systems involvement: Secondary | ICD-10-CM | POA: Diagnosis not present

## 2017-02-21 DIAGNOSIS — Z79899 Other long term (current) drug therapy: Secondary | ICD-10-CM | POA: Diagnosis not present

## 2017-02-25 DIAGNOSIS — F333 Major depressive disorder, recurrent, severe with psychotic symptoms: Secondary | ICD-10-CM | POA: Diagnosis not present

## 2017-02-25 DIAGNOSIS — M0579 Rheumatoid arthritis with rheumatoid factor of multiple sites without organ or systems involvement: Secondary | ICD-10-CM | POA: Diagnosis not present

## 2017-02-25 DIAGNOSIS — Z79899 Other long term (current) drug therapy: Secondary | ICD-10-CM | POA: Diagnosis not present

## 2017-03-07 DIAGNOSIS — F333 Major depressive disorder, recurrent, severe with psychotic symptoms: Secondary | ICD-10-CM | POA: Diagnosis not present

## 2017-04-08 DIAGNOSIS — M1990 Unspecified osteoarthritis, unspecified site: Secondary | ICD-10-CM | POA: Diagnosis not present

## 2017-04-08 DIAGNOSIS — M0579 Rheumatoid arthritis with rheumatoid factor of multiple sites without organ or systems involvement: Secondary | ICD-10-CM | POA: Diagnosis not present

## 2017-04-08 DIAGNOSIS — M150 Primary generalized (osteo)arthritis: Secondary | ICD-10-CM | POA: Diagnosis not present

## 2017-04-08 DIAGNOSIS — Z79899 Other long term (current) drug therapy: Secondary | ICD-10-CM | POA: Diagnosis not present

## 2017-04-25 DIAGNOSIS — F333 Major depressive disorder, recurrent, severe with psychotic symptoms: Secondary | ICD-10-CM | POA: Diagnosis not present

## 2017-05-09 DIAGNOSIS — F333 Major depressive disorder, recurrent, severe with psychotic symptoms: Secondary | ICD-10-CM | POA: Diagnosis not present

## 2017-06-06 DIAGNOSIS — F333 Major depressive disorder, recurrent, severe with psychotic symptoms: Secondary | ICD-10-CM | POA: Diagnosis not present

## 2017-06-27 DIAGNOSIS — F333 Major depressive disorder, recurrent, severe with psychotic symptoms: Secondary | ICD-10-CM | POA: Diagnosis not present

## 2017-06-27 DIAGNOSIS — F4310 Post-traumatic stress disorder, unspecified: Secondary | ICD-10-CM | POA: Diagnosis not present

## 2017-07-01 DIAGNOSIS — M0579 Rheumatoid arthritis with rheumatoid factor of multiple sites without organ or systems involvement: Secondary | ICD-10-CM | POA: Diagnosis not present

## 2017-07-11 DIAGNOSIS — F333 Major depressive disorder, recurrent, severe with psychotic symptoms: Secondary | ICD-10-CM | POA: Diagnosis not present

## 2017-07-11 DIAGNOSIS — F4310 Post-traumatic stress disorder, unspecified: Secondary | ICD-10-CM | POA: Diagnosis not present

## 2017-07-17 DIAGNOSIS — F4310 Post-traumatic stress disorder, unspecified: Secondary | ICD-10-CM | POA: Diagnosis not present

## 2017-07-17 DIAGNOSIS — F333 Major depressive disorder, recurrent, severe with psychotic symptoms: Secondary | ICD-10-CM | POA: Diagnosis not present

## 2017-07-18 DIAGNOSIS — F333 Major depressive disorder, recurrent, severe with psychotic symptoms: Secondary | ICD-10-CM | POA: Diagnosis not present

## 2017-07-18 DIAGNOSIS — F4310 Post-traumatic stress disorder, unspecified: Secondary | ICD-10-CM | POA: Diagnosis not present

## 2017-08-08 DIAGNOSIS — F333 Major depressive disorder, recurrent, severe with psychotic symptoms: Secondary | ICD-10-CM | POA: Diagnosis not present

## 2017-08-08 DIAGNOSIS — F4310 Post-traumatic stress disorder, unspecified: Secondary | ICD-10-CM | POA: Diagnosis not present

## 2017-08-15 DIAGNOSIS — F4310 Post-traumatic stress disorder, unspecified: Secondary | ICD-10-CM | POA: Diagnosis not present

## 2017-08-15 DIAGNOSIS — F333 Major depressive disorder, recurrent, severe with psychotic symptoms: Secondary | ICD-10-CM | POA: Diagnosis not present

## 2017-09-02 DIAGNOSIS — E785 Hyperlipidemia, unspecified: Secondary | ICD-10-CM | POA: Diagnosis not present

## 2017-09-02 DIAGNOSIS — M069 Rheumatoid arthritis, unspecified: Secondary | ICD-10-CM | POA: Diagnosis not present

## 2017-09-02 DIAGNOSIS — D649 Anemia, unspecified: Secondary | ICD-10-CM | POA: Diagnosis not present

## 2017-09-02 DIAGNOSIS — R5382 Chronic fatigue, unspecified: Secondary | ICD-10-CM | POA: Diagnosis not present

## 2017-10-01 DIAGNOSIS — R5382 Chronic fatigue, unspecified: Secondary | ICD-10-CM | POA: Diagnosis not present

## 2017-10-01 DIAGNOSIS — R739 Hyperglycemia, unspecified: Secondary | ICD-10-CM | POA: Diagnosis not present

## 2017-10-04 DIAGNOSIS — F419 Anxiety disorder, unspecified: Secondary | ICD-10-CM | POA: Diagnosis not present

## 2017-10-04 DIAGNOSIS — G894 Chronic pain syndrome: Secondary | ICD-10-CM | POA: Diagnosis not present

## 2017-10-08 DIAGNOSIS — D72819 Decreased white blood cell count, unspecified: Secondary | ICD-10-CM | POA: Diagnosis not present

## 2017-10-08 DIAGNOSIS — M0579 Rheumatoid arthritis with rheumatoid factor of multiple sites without organ or systems involvement: Secondary | ICD-10-CM | POA: Diagnosis not present

## 2017-10-14 DIAGNOSIS — Z79899 Other long term (current) drug therapy: Secondary | ICD-10-CM | POA: Diagnosis not present

## 2017-10-14 DIAGNOSIS — M0579 Rheumatoid arthritis with rheumatoid factor of multiple sites without organ or systems involvement: Secondary | ICD-10-CM | POA: Diagnosis not present

## 2017-10-14 DIAGNOSIS — D709 Neutropenia, unspecified: Secondary | ICD-10-CM | POA: Diagnosis not present

## 2017-10-17 DIAGNOSIS — H40013 Open angle with borderline findings, low risk, bilateral: Secondary | ICD-10-CM | POA: Diagnosis not present

## 2017-10-17 DIAGNOSIS — H2513 Age-related nuclear cataract, bilateral: Secondary | ICD-10-CM | POA: Diagnosis not present

## 2017-10-17 DIAGNOSIS — Z9229 Personal history of other drug therapy: Secondary | ICD-10-CM | POA: Diagnosis not present

## 2017-10-17 DIAGNOSIS — H25013 Cortical age-related cataract, bilateral: Secondary | ICD-10-CM | POA: Diagnosis not present

## 2017-10-17 DIAGNOSIS — Z79899 Other long term (current) drug therapy: Secondary | ICD-10-CM | POA: Diagnosis not present

## 2017-10-22 DIAGNOSIS — F4310 Post-traumatic stress disorder, unspecified: Secondary | ICD-10-CM | POA: Diagnosis not present

## 2017-10-22 DIAGNOSIS — F333 Major depressive disorder, recurrent, severe with psychotic symptoms: Secondary | ICD-10-CM | POA: Diagnosis not present

## 2017-10-29 DIAGNOSIS — F333 Major depressive disorder, recurrent, severe with psychotic symptoms: Secondary | ICD-10-CM | POA: Diagnosis not present

## 2017-10-29 DIAGNOSIS — F4310 Post-traumatic stress disorder, unspecified: Secondary | ICD-10-CM | POA: Diagnosis not present

## 2017-11-07 DIAGNOSIS — F333 Major depressive disorder, recurrent, severe with psychotic symptoms: Secondary | ICD-10-CM | POA: Diagnosis not present

## 2017-11-07 DIAGNOSIS — F4310 Post-traumatic stress disorder, unspecified: Secondary | ICD-10-CM | POA: Diagnosis not present

## 2017-12-05 ENCOUNTER — Telehealth: Payer: Self-pay | Admitting: Specialist

## 2017-12-05 NOTE — Telephone Encounter (Signed)
Okay to schedule NP appt at their convenience.

## 2017-12-05 NOTE — Telephone Encounter (Signed)
Ok

## 2017-12-05 NOTE — Telephone Encounter (Signed)
Pt came in and is requesting Dr. Carollee Herter as primary care provider. She states her sister Osborne Casco is a patient with Dr. Carollee Herter and her deceased mother Sibyl Parr was a patient of also. Please advise.

## 2017-12-05 NOTE — Telephone Encounter (Signed)
Pt scheduled for 12/20/17 @ 10:30am

## 2017-12-06 ENCOUNTER — Other Ambulatory Visit: Payer: Self-pay | Admitting: Obstetrics & Gynecology

## 2017-12-06 DIAGNOSIS — Z1231 Encounter for screening mammogram for malignant neoplasm of breast: Secondary | ICD-10-CM

## 2017-12-11 ENCOUNTER — Telehealth: Payer: Self-pay | Admitting: *Deleted

## 2017-12-11 NOTE — Telephone Encounter (Signed)
Copied from [facility] [id number]. Topic: General - Other >> Dec 11, 2017 10:12 AM Marin Olp L wrote: Reason for CRM: Patient wants to know if her records have been received yet from her previous PCP. She filled out a release om your office already.

## 2017-12-13 NOTE — Telephone Encounter (Signed)
Patient notified that we have not seen anything yet.

## 2017-12-19 DIAGNOSIS — F333 Major depressive disorder, recurrent, severe with psychotic symptoms: Secondary | ICD-10-CM | POA: Diagnosis not present

## 2017-12-19 DIAGNOSIS — F4310 Post-traumatic stress disorder, unspecified: Secondary | ICD-10-CM | POA: Diagnosis not present

## 2017-12-20 ENCOUNTER — Ambulatory Visit (INDEPENDENT_AMBULATORY_CARE_PROVIDER_SITE_OTHER): Payer: Medicare Other | Admitting: Family Medicine

## 2017-12-20 ENCOUNTER — Encounter: Payer: Self-pay | Admitting: Family Medicine

## 2017-12-20 VITALS — BP 109/67 | HR 88 | Temp 98.0°F | Resp 16 | Ht 62.0 in | Wt 238.0 lb

## 2017-12-20 DIAGNOSIS — G894 Chronic pain syndrome: Secondary | ICD-10-CM | POA: Diagnosis not present

## 2017-12-20 DIAGNOSIS — M0579 Rheumatoid arthritis with rheumatoid factor of multiple sites without organ or systems involvement: Secondary | ICD-10-CM

## 2017-12-20 DIAGNOSIS — R768 Other specified abnormal immunological findings in serum: Secondary | ICD-10-CM

## 2017-12-20 DIAGNOSIS — E785 Hyperlipidemia, unspecified: Secondary | ICD-10-CM | POA: Diagnosis not present

## 2017-12-20 LAB — COMPREHENSIVE METABOLIC PANEL
ALT: 30 U/L (ref 0–35)
AST: 27 U/L (ref 0–37)
Albumin: 4.4 g/dL (ref 3.5–5.2)
Alkaline Phosphatase: 84 U/L (ref 39–117)
BUN: 11 mg/dL (ref 6–23)
CO2: 28 mEq/L (ref 19–32)
Calcium: 10.7 mg/dL — ABNORMAL HIGH (ref 8.4–10.5)
Chloride: 101 mEq/L (ref 96–112)
Creatinine, Ser: 0.67 mg/dL (ref 0.40–1.20)
GFR: 117.38 mL/min (ref >60.00)
Glucose, Bld: 83 mg/dL (ref 70–99)
Potassium: 3.6 mEq/L (ref 3.5–5.1)
Sodium: 138 mEq/L (ref 135–145)
Total Bilirubin: 1.1 mg/dL (ref 0.2–1.2)
Total Protein: 7.9 g/dL (ref 6.0–8.3)

## 2017-12-20 LAB — LIPID PANEL
Cholesterol: 205 mg/dL — ABNORMAL HIGH (ref 0–200)
HDL: 67.1 mg/dL (ref >39.00)
LDL Cholesterol: 122 mg/dL — ABNORMAL HIGH (ref 0–99)
NonHDL: 138.25
Total CHOL/HDL Ratio: 3
Triglycerides: 81 mg/dL (ref 0.0–149.0)
VLDL: 16.2 mg/dL (ref 0.0–40.0)

## 2017-12-20 MED ORDER — OXYCODONE HCL 15 MG PO TABS: 15.0000 mg | ORAL_TABLET | ORAL | 0 refills | Status: DC | PRN

## 2017-12-20 MED ORDER — ATORVASTATIN CALCIUM 10 MG PO TABS: 10.0000 mg | ORAL_TABLET | Freq: Every day | ORAL | 1 refills | Status: DC

## 2017-12-20 NOTE — Progress Notes (Signed)
Patient ID: Debra Everett, female    DOB: Oct 30, 1960  Age: 57 y.o. MRN: [id number]    Subjective:  Subjective  HPI AMARYLLIS MALMQUIST presents to establish a new pcp.  Her mom was vernie Product manager and sister is Osborne Casco.   She is needing a referral to pain management for her chronic pain , rheumatoid arthritis.      Review of Systems  Constitutional: Negative for chills and fever.  HENT: Negative for congestion and hearing loss.   Eyes: Negative for discharge.  Respiratory: Negative for cough and shortness of breath.   Cardiovascular: Negative for chest pain, palpitations and leg swelling.  Gastrointestinal: Negative for abdominal pain, blood in stool, constipation, diarrhea, nausea and vomiting.  Genitourinary: Negative for dysuria, frequency, hematuria and urgency.  Musculoskeletal: Negative for back pain and myalgias.  Skin: Negative for rash.  Allergic/Immunologic: Negative for environmental allergies.  Neurological: Negative for dizziness, weakness and headaches.  Hematological: Does not bruise/bleed easily.  Psychiatric/Behavioral: Positive for sleep disturbance. Negative for suicidal ideas. The patient is nervous/anxious.     History Past Medical History:  Diagnosis Date  . Anemia    as teenager  . Anxiety   . Arthritis   . Depression   . Fibroid   . Headache(784.0)   . RA (rheumatoid arthritis) ([place])     She has a past surgical history that includes Abdominal surgery and Appendectomy.   Her family history includes Arthritis in her maternal grandmother and mother; Breast cancer in her sister; Depression in her sister; Hyperlipidemia in her mother and sister; Hypertension in her mother and sister; Kidney disease in her sister; Mental retardation in her son; Stroke in her mother.She reports that she has quit smoking. Her smoking use included cigarettes. She started smoking about 37 years ago. She smoked 1.00 pack per day. She has quit using smokeless tobacco. She reports that  she does not drink alcohol or use drugs.  Current Outpatient Medications on File Prior to Visit  Medication Sig Dispense Refill  . Adalimumab 40 MG/0.8ML PNKT Inject 40 mg into the skin once a week. On Fridays.    . ALPRAZolam (XANAX) 1 MG tablet Take 1 tablet by mouth 2 (two) times daily as needed for anxiety.    Marland Kitchen lisinopril-hydrochlorothiazide (PRINZIDE,ZESTORETIC) 20-12.5 MG tablet Take 1 tablet by mouth daily.    Marland Kitchen sulfaSALAzine (AZULFIDINE) 500 MG tablet Take 1,000 mg by mouth 2 (two) times daily.  0   No current facility-administered medications on file prior to visit.      Objective:  Objective  Physical Exam  Constitutional: She is oriented to person, place, and time. She appears well-developed and well-nourished.  HENT:  Head: Normocephalic and atraumatic.  Eyes: Conjunctivae and EOM are normal.  Neck: Normal range of motion. Neck supple. No JVD present. Carotid bruit is not present. No thyromegaly present.  Cardiovascular: Normal rate, regular rhythm and normal heart sounds.  No murmur heard. Pulmonary/Chest: Effort normal and breath sounds normal. No respiratory distress. She has no wheezes. She has no rales. She exhibits no tenderness.  Musculoskeletal: She exhibits no edema.  Neurological: She is alert and oriented to person, place, and time.  Psychiatric: She has a normal mood and affect.  Nursing note and vitals reviewed.  BP 109/67 (BP Location: Right Arm, Cuff Size: Large)   Pulse 88   Temp 98 F (36.7 C) (Oral)   Resp 16   Ht 5\' 2"  (1.575 m)   Wt 238 lb (108 kg)  LMP 10/07/2013   SpO2 98%   BMI 43.53 kg/m  Wt Readings from Last 3 Encounters:  12/20/17 238 lb (108 kg)  03/27/16 235 lb (106.6 kg)  09/12/15 250 lb 6.4 oz (113.6 kg)     Lab Results  Component Value Date   WBC 5.9 03/27/2016   HGB 14.0 03/27/2016   HCT 38.3 03/27/2016   PLT 295 03/27/2016   GLUCOSE 83 12/20/2017   CHOL 205 (H) 12/20/2017   TRIG 81.0 12/20/2017   HDL 67.10  12/20/2017   LDLCALC 122 (H) 12/20/2017   ALT 30 12/20/2017   AST 27 12/20/2017   NA 138 12/20/2017   K 3.6 12/20/2017   CL 101 12/20/2017   CREATININE 0.67 12/20/2017   BUN 11 12/20/2017   CO2 28 12/20/2017   TSH 1.901 01/06/2014    Mm Screening Breast Tomo Bilateral  Result Date: 11/27/2016 CLINICAL DATA:  Screening. EXAM: 2D DIGITAL SCREENING BILATERAL MAMMOGRAM WITH CAD AND ADJUNCT TOMO COMPARISON:  Previous exam(s). ACR Breast Density Category b: There are scattered areas of fibroglandular density. FINDINGS: There are no findings suspicious for malignancy. Images were processed with CAD. IMPRESSION: No mammographic evidence of malignancy. A result letter of this screening mammogram will be mailed directly to the patient. RECOMMENDATION: Screening mammogram in one year. (Code:SM-B-01Y) BI-RADS CATEGORY  1: Negative. Electronically Signed   By: Lajean Manes M.D.   On: 11/27/2016 16:19     Assessment & Plan:  Plan  I have discontinued Mardene Celeste A. Freshour's hydroxychloroquine, lurasidone, and ondansetron. I have also changed her atorvastatin. Additionally, I am having her maintain her lisinopril-hydrochlorothiazide, Adalimumab, ALPRAZolam, sulfaSALAzine, and oxyCODONE.  Meds ordered this encounter  Medications  . atorvastatin (LIPITOR) 10 MG tablet    Sig: Take 1 tablet (10 mg total) by mouth at bedtime.    Dispense:  90 tablet    Refill:  1  . DISCONTD: oxyCODONE (ROXICODONE) 15 MG immediate release tablet    Sig: Take 1 tablet (15 mg total) by mouth every 4 (four) hours as needed for pain.    Dispense:  30 tablet    Refill:  0  . oxyCODONE (ROXICODONE) 15 MG immediate release tablet    Sig: Take 1 tablet (15 mg total) by mouth every 4 (four) hours as needed for pain.    Dispense:  30 tablet    Refill:  0    Problem List Items Addressed This Visit      Unprioritized   Chronic pain - Primary    Pt being referred to pain management  Will fill meds until she gets in        Relevant Medications   atorvastatin (LIPITOR) 10 MG tablet   oxyCODONE (ROXICODONE) 15 MG immediate release tablet   Other Relevant Orders   Ambulatory referral to Pain Clinic   Pain Mgmt, Profile 8 w/Conf, U (Completed)   Hyperlipidemia LDL goal <100    Encouraged heart healthy diet, increase exercise, avoid trans fats, consider a krill oil cap daily      Relevant Medications   atorvastatin (LIPITOR) 10 MG tablet   Other Relevant Orders   Lipid panel (Completed)   Comprehensive metabolic panel (Completed)   Rheumatoid arthritis involving multiple sites with positive rheumatoid factor (HCC)   Relevant Medications   atorvastatin (LIPITOR) 10 MG tablet   oxyCODONE (ROXICODONE) 15 MG immediate release tablet   Other Relevant Orders   Ambulatory referral to Pain Clinic   Pain Mgmt, Profile 8 w/Conf, U (Completed)  Rheumatoid factor positive    Per rheumatology         Follow-up: Return in about 3 months (around 03/22/2018).  Ann Held, DO

## 2017-12-20 NOTE — Patient Instructions (Signed)

## 2017-12-22 DIAGNOSIS — M0579 Rheumatoid arthritis with rheumatoid factor of multiple sites without organ or systems involvement: Secondary | ICD-10-CM | POA: Insufficient documentation

## 2017-12-22 DIAGNOSIS — E785 Hyperlipidemia, unspecified: Secondary | ICD-10-CM | POA: Insufficient documentation

## 2017-12-22 LAB — PAIN MGMT, PROFILE 8 W/CONF, U
6 Acetylmorphine: NEGATIVE ng/mL (ref <10)
Alcohol Metabolites: NEGATIVE ng/mL (ref <500)
Amphetamines: NEGATIVE ng/mL (ref <500)
Benzodiazepines: NEGATIVE ng/mL (ref <100)
Buprenorphine, Urine: NEGATIVE ng/mL (ref <5)
Cocaine Metabolite: NEGATIVE ng/mL (ref <150)
Codeine: NEGATIVE ng/mL (ref <50)
Creatinine: 78.7 mg/dL
Hydrocodone: 513 ng/mL — ABNORMAL HIGH (ref <50)
Hydromorphone: 88 ng/mL — ABNORMAL HIGH (ref <50)
MDMA: NEGATIVE ng/mL (ref <500)
Marijuana Metabolite: NEGATIVE ng/mL (ref <20)
Morphine: NEGATIVE ng/mL (ref <50)
Norhydrocodone: 1182 ng/mL — ABNORMAL HIGH (ref <50)
Opiates: POSITIVE ng/mL — AB (ref <100)
Oxidant: NEGATIVE ug/mL (ref <200)
Oxycodone: NEGATIVE ng/mL (ref <100)
pH: 6.76 (ref 4.5–9.0)

## 2017-12-22 NOTE — Assessment & Plan Note (Signed)
Pt being referred to pain management  Will fill meds until she gets in

## 2017-12-22 NOTE — Assessment & Plan Note (Signed)
Encouraged heart healthy diet, increase exercise, avoid trans fats, consider a krill oil cap daily 

## 2017-12-22 NOTE — Assessment & Plan Note (Signed)
Per rheumatology 

## 2017-12-23 ENCOUNTER — Other Ambulatory Visit: Payer: Self-pay | Admitting: Family Medicine

## 2017-12-23 DIAGNOSIS — M0579 Rheumatoid arthritis with rheumatoid factor of multiple sites without organ or systems involvement: Secondary | ICD-10-CM

## 2017-12-23 DIAGNOSIS — G894 Chronic pain syndrome: Secondary | ICD-10-CM

## 2017-12-23 MED ORDER — OXYCODONE HCL 15 MG PO TABS: 15.0000 mg | ORAL_TABLET | ORAL | 0 refills | Status: DC | PRN

## 2017-12-23 NOTE — Telephone Encounter (Signed)
Database ran on 12/20/17  Last written: 12/20/17 Last ov: 12/20/17 Next ov: 03/28/18 Contract: 12/21/18 UDS: 06/20/18  Is there any way she can get more?

## 2017-12-23 NOTE — Telephone Encounter (Signed)
I will refill  But Debra Everett informed us that we can not do referrals for her due to her having medicare--- she may have to go to the dr on her card in order to get the referral We won't be able to indefinitely write her pain meds

## 2017-12-23 NOTE — Telephone Encounter (Signed)
Pt came in and requested to have her Rx for ROXICODONE 15 mg refilled early, due to the upcoming holiday.

## 2017-12-31 NOTE — Telephone Encounter (Signed)
Please leave this message for [facility]

## 2018-01-01 ENCOUNTER — Telehealth: Payer: Self-pay | Admitting: Family Medicine

## 2018-01-01 NOTE — Telephone Encounter (Signed)
Copied from [place] [id number]. Topic: Quick Communication - Rx Refill/Question >> Jan 01, 2018 11:53 AM Leward Quan A wrote: Medication: oxyCODONE (ROXICODONE) 15 MG immediate release tablet, ALPRAZolam (XANAX) 1 MG tablet  Has the patient contacted their pharmacy? Yes.   (Agent: If no, request that the patient contact the pharmacy for the refill.) (Agent: If yes, when and what did the pharmacy advise?)  Preferred Pharmacy (with phone number or street name): [facility], [address] [phone] (Phone) [phone] (Fax)    Agent: Please be advised that RX refills may take up to 3 business days. We ask that you follow-up with your pharmacy.

## 2018-01-02 MED ORDER — ALPRAZOLAM 1 MG PO TABS: 1.0000 mg | ORAL_TABLET | Freq: Two times a day (BID) | ORAL | 0 refills | Status: DC | PRN

## 2018-01-02 NOTE — Telephone Encounter (Signed)
12/24/2017  1   12/23/2017  Oxycodone Hcl 15 Mg Tablet  60.00 10 Yv Low  [id number]  [place] (2914)  0/0 135.00 MME Comm Ins  [place]  12/20/2017  1   12/20/2017  Oxycodone Hcl 15 Mg Tablet  30.00 5 Yv Low  [id number]  [facility] (2914)  0/0 135.00 MME Comm Ins  [place]  11/01/2017  1   11/01/2017  Alprazolam 0.5 Mg Tablet  30.00 15 Dw Billey Gosling  [id number]  [facility] (2914)  0/0 2.00 LME Comm Ins  [place]  11/01/2017  1   11/01/2017  Oxycodone Hcl 15 Mg Tablet  60.00 15 Dw Wil  [id number]  [facility] (2914)  0/0 90.00 MME Comm Ins  [place]  10/04/2017  1   10/04/2017  Oxycodone Hcl 15 Mg Tablet  120.00 30 Dw Billey Gosling  [id number]  [facility] (2914)  0/0 90.00 MME Comm Ins  [place]  10/04/2017  1   10/04/2017  Alprazolam 1 Mg Tablet  60.00 30 Dw Billey Gosling  [id number]  [facility] (2914)  0/0 4.00 LME Comm Ins  [place]  09/06/2017  1   09/06/2017  Oxycodone Hcl 15 Mg Tablet  120.00 30 Dw Billey Gosling  [id number]  [facility] (2914)  0/0 90.00 MME Comm Ins  [place]  09/06/2017  1   09/06/2017  Alprazolam 1 Mg Tablet  60.00 30 Dw Billey Gosling  [id number]  [facility] (2914)  0/0 4.00 LME Comm Ins  [place]  08/09/2017  1   08/09/2017  Oxycodone Hcl 15 Mg Tablet  120.00 30 Dw Billey Gosling  [id number]  [facility] (2914)  0/0 90.00 MME Comm Ins  [place]  08/09/2017  1   08/09/2017  Alprazolam 1 Mg Tablet  60.00 30 Dw Billey Gosling  [id number]  [facility] (2914)  0/0 4.00 LME Comm Ins  [place]  07/12/2017  1   07/12/2017  Oxycodone Hcl 15 Mg Tablet  120.00 30 Dw Billey Gosling  [id number]  [facility] (2914)  0/0 90.00 MME Comm Ins  [place]  07/12/2017  1   07/12/2017  Alprazolam 1 Mg Tablet  60.00 30 Dw Billey Gosling  [id number]  [facility] (2914)  0/0 4.00 LME Comm Ins    06/14/2017  1   06/14/2017  Alprazolam 1 Mg Tablet  60.00 30 Dw Billey Gosling  [id number]  [facility] (2914)  0/0 4.00         Reviewed her NCCSR as above It looks like she is set on the oxycodone but can have a xanax refill.  Will give her 1 month/ 60 pills in Dr. Nonda Lou absence today

## 2018-01-02 NOTE — Telephone Encounter (Signed)
Spoke with [facility] and they stated that they will take medicare and medicaid together.  Message sent to referral coordinator to send referral over.  Looking at her card on file she does not have [place] access

## 2018-01-02 NOTE — Telephone Encounter (Signed)
Last filled per database:  Oxy 12/24/17 alprazolam 11/01/17 Last written: oxy 12/24/17 alprazolam not written by Etter Sjogren yet, new patient and did not need at ov Last ov: 12/20/17 Next ov: 03/28/18 Contract: 12/21/18 UDS: 03/22/18  Can you please fill in Dr. Ivy Lynn absence

## 2018-01-06 ENCOUNTER — Telehealth: Payer: Self-pay | Admitting: Family Medicine

## 2018-01-06 DIAGNOSIS — G894 Chronic pain syndrome: Secondary | ICD-10-CM

## 2018-01-06 DIAGNOSIS — M0579 Rheumatoid arthritis with rheumatoid factor of multiple sites without organ or systems involvement: Secondary | ICD-10-CM

## 2018-01-07 NOTE — Telephone Encounter (Signed)
  Last written: 12/23/17 Last ov: 12/20/17 Next ov: 03/28/18 Contract: 12/21/18 UDS: 03/22/18

## 2018-01-08 DIAGNOSIS — H04123 Dry eye syndrome of bilateral lacrimal glands: Secondary | ICD-10-CM | POA: Diagnosis not present

## 2018-01-08 DIAGNOSIS — H16223 Keratoconjunctivitis sicca, not specified as Sjogren's, bilateral: Secondary | ICD-10-CM | POA: Diagnosis not present

## 2018-01-09 NOTE — Telephone Encounter (Signed)
Is she completely out ?  It is early

## 2018-01-10 ENCOUNTER — Other Ambulatory Visit: Payer: Self-pay | Admitting: Family Medicine

## 2018-01-10 ENCOUNTER — Telehealth: Payer: Self-pay | Admitting: *Deleted

## 2018-01-10 DIAGNOSIS — G894 Chronic pain syndrome: Secondary | ICD-10-CM

## 2018-01-10 DIAGNOSIS — M0579 Rheumatoid arthritis with rheumatoid factor of multiple sites without organ or systems involvement: Secondary | ICD-10-CM

## 2018-01-10 MED ORDER — OXYCODONE HCL 15 MG PO TABS: 15.0000 mg | ORAL_TABLET | ORAL | 0 refills | Status: DC | PRN

## 2018-01-10 NOTE — Telephone Encounter (Signed)
[name] would like a refill of the following medications:    oxyCODONE (ROXICODONE) 15 MG   Preferred pharmacy: [place], [address]  She stated she has 2 pills left.

## 2018-01-10 NOTE — Telephone Encounter (Signed)
Did you get this message?  Message from 01/02/18 Spoke with [facility] and they stated that they will take medicare and medicaid together.  Message sent to referral coordinator to send referral over.

## 2018-01-15 NOTE — Telephone Encounter (Signed)
Referral sent to [facility] pain management

## 2018-01-17 ENCOUNTER — Ambulatory Visit
Admission: RE | Admit: 2018-01-17 | Discharge: 2018-01-17 | Disposition: A | Discharge status: Home / Self Care | Payer: Medicare Other | Source: Ambulatory Visit | Attending: Obstetrics & Gynecology | Admitting: Obstetrics & Gynecology

## 2018-01-17 ENCOUNTER — Other Ambulatory Visit: Payer: Self-pay | Admitting: Family Medicine

## 2018-01-17 DIAGNOSIS — Z1231 Encounter for screening mammogram for malignant neoplasm of breast: Secondary | ICD-10-CM

## 2018-01-23 ENCOUNTER — Other Ambulatory Visit: Payer: Self-pay | Admitting: Family Medicine

## 2018-01-23 DIAGNOSIS — M0579 Rheumatoid arthritis with rheumatoid factor of multiple sites without organ or systems involvement: Secondary | ICD-10-CM

## 2018-01-23 DIAGNOSIS — G894 Chronic pain syndrome: Secondary | ICD-10-CM

## 2018-01-23 NOTE — Telephone Encounter (Signed)
Oxycodone refill request left pended for Dr. Etter Sjogren review.

## 2018-01-23 NOTE — Telephone Encounter (Signed)
Copied from [place] [phone]. Topic: Quick Communication - Rx Refill/Question >> Jan 23, 2018 10:23 AM Bea Graff, NT wrote: Medication: oxyCODONE (ROXICODONE) 15 MG immediate release tablet   Has the patient contacted their pharmacy? Yes.   (Agent: If no, request that the patient contact the pharmacy for the refill.) (Agent: If yes, when and what did the pharmacy advise?)  Preferred Pharmacy (with phone number or street name): [id number] - [place], [place] [phone] (Phone) [phone] (Fax)    Agent: Please be advised that RX refills may take up to 3 business days. We ask that you follow-up with your pharmacy.

## 2018-01-23 NOTE — Telephone Encounter (Signed)
Requested medication (s) are due for refill today: yes  Requested medication (s) are on the active medication list: yes    Last refill: 01/10/18  60  0 refills  Future visit scheduled yes  03/28/2018  Notes to clinic:not delegated  Requested Prescriptions  Pending Prescriptions Disp Refills   oxyCODONE (ROXICODONE) 15 MG immediate release tablet 60 tablet 0    Sig: Take 1 tablet (15 mg total) by mouth every 4 (four) hours as needed for pain.     Not Delegated - Analgesics:  Opioid Agonists Failed - 01/23/2018 10:32 AM      Failed - This refill cannot be delegated      Failed - Urine Drug Screen completed in last 360 days.      Passed - Valid encounter within last 6 months    Recent Outpatient Visits          1 month ago Chronic pain syndrome   [facility], DO      Future Appointments            In 2 months [place], DO [employer] at [employer], [place]

## 2018-01-24 NOTE — Telephone Encounter (Signed)
When  Is her app with pain management?  She is using a lot of pain meds

## 2018-01-24 NOTE — Telephone Encounter (Signed)
Database ran on 12/20/17  Last written:01/10/18 Last ov: 12/20/17 Next ov: 03/28/17 Contract: 12/21/18 UDS: 06/20/18

## 2018-01-28 NOTE — Telephone Encounter (Signed)
Pt states that she does need her pain medication refilled.  Pt states her appointment with pain management isn't until January 17th.  Pt can be reached at [phone]

## 2018-01-31 ENCOUNTER — Other Ambulatory Visit: Payer: Self-pay | Admitting: Family Medicine

## 2018-01-31 DIAGNOSIS — G894 Chronic pain syndrome: Secondary | ICD-10-CM

## 2018-01-31 DIAGNOSIS — M0579 Rheumatoid arthritis with rheumatoid factor of multiple sites without organ or systems involvement: Secondary | ICD-10-CM

## 2018-01-31 MED ORDER — OXYCODONE HCL 15 MG PO TABS: 15.0000 mg | ORAL_TABLET | ORAL | 0 refills | Status: AC | PRN

## 2018-01-31 NOTE — Telephone Encounter (Signed)
Pt checking status to see if she can get her oxycodone refilled.

## 2018-01-31 NOTE — Telephone Encounter (Signed)
sent 

## 2018-02-03 NOTE — Telephone Encounter (Signed)
Left message on machine that medication was sent to pharmacy.

## 2018-02-05 ENCOUNTER — Telehealth: Payer: Self-pay | Admitting: *Deleted

## 2018-02-05 DIAGNOSIS — G894 Chronic pain syndrome: Secondary | ICD-10-CM

## 2018-02-05 NOTE — Telephone Encounter (Signed)
Copied from [place] [phone]. Topic: Referral - Request for Referral >> Feb 05, 2018  2:16 PM Alanda Slim E wrote: Has patient seen PCP for this complaint? Yes   Referral for which specialty: Pain management  Preferred provider/office: [facility] - Pain management  Reason for referral: Pt wants to change the previous request due to not being too fond of [facility]

## 2018-02-07 NOTE — Telephone Encounter (Signed)
Ok to change referral

## 2018-02-09 ENCOUNTER — Other Ambulatory Visit: Payer: Self-pay | Admitting: Family Medicine

## 2018-02-10 DIAGNOSIS — E559 Vitamin D deficiency, unspecified: Secondary | ICD-10-CM | POA: Diagnosis not present

## 2018-02-10 DIAGNOSIS — Z7952 Long term (current) use of systemic steroids: Secondary | ICD-10-CM | POA: Diagnosis not present

## 2018-02-10 DIAGNOSIS — M0579 Rheumatoid arthritis with rheumatoid factor of multiple sites without organ or systems involvement: Secondary | ICD-10-CM | POA: Diagnosis not present

## 2018-02-10 DIAGNOSIS — Z79899 Other long term (current) drug therapy: Secondary | ICD-10-CM | POA: Diagnosis not present

## 2018-02-10 NOTE — Telephone Encounter (Signed)
Refill request for alprazolam.   Last OV: 12/20/2017 Last Fill: 01/02/2018 #60 and 0RF UDS: 12/20/2017 Moderate risk

## 2018-02-10 NOTE — Telephone Encounter (Signed)
Patient stated that she has an appointment tomorrow at [facility] at [address] with Dr. Kellie Shropshire.  Referral placed so we can send records.

## 2018-02-11 DIAGNOSIS — M79672 Pain in left foot: Secondary | ICD-10-CM | POA: Diagnosis not present

## 2018-02-11 DIAGNOSIS — M549 Dorsalgia, unspecified: Secondary | ICD-10-CM | POA: Diagnosis not present

## 2018-02-11 DIAGNOSIS — E559 Vitamin D deficiency, unspecified: Secondary | ICD-10-CM | POA: Diagnosis not present

## 2018-02-11 DIAGNOSIS — M545 Low back pain: Secondary | ICD-10-CM | POA: Diagnosis not present

## 2018-02-11 DIAGNOSIS — M129 Arthropathy, unspecified: Secondary | ICD-10-CM | POA: Diagnosis not present

## 2018-02-11 DIAGNOSIS — G8929 Other chronic pain: Secondary | ICD-10-CM | POA: Diagnosis not present

## 2018-02-11 DIAGNOSIS — M79641 Pain in right hand: Secondary | ICD-10-CM | POA: Diagnosis not present

## 2018-02-11 DIAGNOSIS — M79642 Pain in left hand: Secondary | ICD-10-CM | POA: Diagnosis not present

## 2018-02-11 DIAGNOSIS — Z79899 Other long term (current) drug therapy: Secondary | ICD-10-CM | POA: Diagnosis not present

## 2018-02-11 DIAGNOSIS — M79671 Pain in right foot: Secondary | ICD-10-CM | POA: Diagnosis not present

## 2018-02-11 MED ORDER — ALPRAZOLAM 1 MG PO TABS: 1.0000 mg | ORAL_TABLET | Freq: Two times a day (BID) | ORAL | 0 refills | Status: DC | PRN

## 2018-02-11 NOTE — Telephone Encounter (Signed)
Can renew x 1 but she should not be on narcotic with xanax

## 2018-02-13 DIAGNOSIS — M79672 Pain in left foot: Secondary | ICD-10-CM | POA: Diagnosis not present

## 2018-02-13 DIAGNOSIS — M549 Dorsalgia, unspecified: Secondary | ICD-10-CM | POA: Diagnosis not present

## 2018-02-13 DIAGNOSIS — M79641 Pain in right hand: Secondary | ICD-10-CM | POA: Diagnosis not present

## 2018-02-13 DIAGNOSIS — M79671 Pain in right foot: Secondary | ICD-10-CM | POA: Diagnosis not present

## 2018-02-13 DIAGNOSIS — M545 Low back pain: Secondary | ICD-10-CM | POA: Diagnosis not present

## 2018-02-13 DIAGNOSIS — M79642 Pain in left hand: Secondary | ICD-10-CM | POA: Diagnosis not present

## 2018-02-13 DIAGNOSIS — M542 Cervicalgia: Secondary | ICD-10-CM | POA: Diagnosis not present

## 2018-02-14 ENCOUNTER — Telehealth: Payer: Self-pay | Admitting: Family Medicine

## 2018-02-14 DIAGNOSIS — M2550 Pain in unspecified joint: Secondary | ICD-10-CM

## 2018-02-14 NOTE — Telephone Encounter (Signed)
Patient would like a referral to:  [facility]  Dr. Marella Chimes Phone: [phone] Fax: [phone]

## 2018-02-18 NOTE — Telephone Encounter (Signed)
Are you ok with referral to rheumatology too?

## 2018-02-18 NOTE — Telephone Encounter (Signed)
Ok  Dx polyarthralgia

## 2018-02-19 NOTE — Telephone Encounter (Signed)
Patient notified that referral has been placed  

## 2018-02-25 DIAGNOSIS — M79642 Pain in left hand: Secondary | ICD-10-CM | POA: Diagnosis not present

## 2018-02-25 DIAGNOSIS — Z79899 Other long term (current) drug therapy: Secondary | ICD-10-CM | POA: Diagnosis not present

## 2018-02-25 DIAGNOSIS — M79641 Pain in right hand: Secondary | ICD-10-CM | POA: Diagnosis not present

## 2018-02-25 DIAGNOSIS — M545 Low back pain: Secondary | ICD-10-CM | POA: Diagnosis not present

## 2018-02-25 DIAGNOSIS — G8929 Other chronic pain: Secondary | ICD-10-CM | POA: Diagnosis not present

## 2018-02-26 DIAGNOSIS — F4310 Post-traumatic stress disorder, unspecified: Secondary | ICD-10-CM | POA: Diagnosis not present

## 2018-02-26 DIAGNOSIS — F333 Major depressive disorder, recurrent, severe with psychotic symptoms: Secondary | ICD-10-CM | POA: Diagnosis not present

## 2018-03-14 ENCOUNTER — Other Ambulatory Visit: Payer: Self-pay | Admitting: Family Medicine

## 2018-03-17 MED ORDER — ALPRAZOLAM 1 MG PO TABS: 1.0000 mg | ORAL_TABLET | Freq: Two times a day (BID) | ORAL | 0 refills | Status: DC | PRN

## 2018-03-17 NOTE — Telephone Encounter (Signed)
Refill request for alprazolam.   Last OV; 12/20/2017 Last Fill: 02/11/2018 #60 and 0RF UDS: 12/20/2017 Moderate risk

## 2018-03-18 DIAGNOSIS — Z7952 Long term (current) use of systemic steroids: Secondary | ICD-10-CM | POA: Diagnosis not present

## 2018-03-18 DIAGNOSIS — Z79899 Other long term (current) drug therapy: Secondary | ICD-10-CM | POA: Diagnosis not present

## 2018-03-18 DIAGNOSIS — M0579 Rheumatoid arthritis with rheumatoid factor of multiple sites without organ or systems involvement: Secondary | ICD-10-CM | POA: Diagnosis not present

## 2018-03-19 ENCOUNTER — Telehealth: Payer: Self-pay | Admitting: *Deleted

## 2018-03-19 NOTE — Telephone Encounter (Signed)
Received fax from rheumatology stating they needed other rheum records.  Notified patient and she stated that she had her appt yesterday and they had records.

## 2018-03-24 ENCOUNTER — Ambulatory Visit (INDEPENDENT_AMBULATORY_CARE_PROVIDER_SITE_OTHER): Payer: Medicare Other | Admitting: Family Medicine

## 2018-03-24 ENCOUNTER — Encounter: Payer: Self-pay | Admitting: Family Medicine

## 2018-03-24 VITALS — BP 112/74 | HR 96 | Ht 62.0 in | Wt 242.0 lb

## 2018-03-24 DIAGNOSIS — E785 Hyperlipidemia, unspecified: Secondary | ICD-10-CM

## 2018-03-24 DIAGNOSIS — M0579 Rheumatoid arthritis with rheumatoid factor of multiple sites without organ or systems involvement: Secondary | ICD-10-CM

## 2018-03-24 DIAGNOSIS — I10 Essential (primary) hypertension: Secondary | ICD-10-CM | POA: Diagnosis not present

## 2018-03-24 LAB — COMPREHENSIVE METABOLIC PANEL
ALT: 30 U/L (ref 0–35)
AST: 27 U/L (ref 0–37)
Albumin: 4.2 g/dL (ref 3.5–5.2)
Alkaline Phosphatase: 94 U/L (ref 39–117)
BUN: 11 mg/dL (ref 6–23)
CO2: 28 mEq/L (ref 19–32)
Calcium: 10 mg/dL (ref 8.4–10.5)
Chloride: 101 mEq/L (ref 96–112)
Creatinine, Ser: 0.66 mg/dL (ref 0.40–1.20)
GFR: 112.26 mL/min (ref >60.00)
Glucose, Bld: 106 mg/dL — ABNORMAL HIGH (ref 70–99)
Potassium: 4 mEq/L (ref 3.5–5.1)
Sodium: 136 mEq/L (ref 135–145)
Total Bilirubin: 0.6 mg/dL (ref 0.2–1.2)
Total Protein: 7.6 g/dL (ref 6.0–8.3)

## 2018-03-24 LAB — LIPID PANEL
Cholesterol: 175 mg/dL (ref 0–200)
HDL: 61.9 mg/dL (ref >39.00)
LDL Cholesterol: 81 mg/dL (ref 0–99)
NonHDL: 112.62
Total CHOL/HDL Ratio: 3
Triglycerides: 160 mg/dL — ABNORMAL HIGH (ref 0.0–149.0)
VLDL: 32 mg/dL (ref 0.0–40.0)

## 2018-03-24 NOTE — Assessment & Plan Note (Signed)
Tolerating statin, encouraged heart healthy diet, avoid trans fats, minimize simple carbs and saturated fats. Increase exercise as tolerated 

## 2018-03-24 NOTE — Progress Notes (Signed)
Patient ID: Debra Everett, female    DOB: 09-15-1960  Age: 58 y.o. MRN: [id number]    Subjective:  Subjective  HPI Debra Everett presents for f/u pain, chol and bp.  No complaints.  Pt is feeling much better now that she has a new rheum and is on enbrel   Review of Systems  Constitutional: Negative for appetite change, diaphoresis, fatigue and unexpected weight change.  Eyes: Negative for pain, redness and visual disturbance.  Respiratory: Negative for cough, chest tightness, shortness of breath and wheezing.   Cardiovascular: Negative for chest pain, palpitations and leg swelling.  Endocrine: Negative for cold intolerance, heat intolerance, polydipsia, polyphagia and polyuria.  Genitourinary: Negative for difficulty urinating, dysuria and frequency.  Musculoskeletal:       Walking with cane but walking / moving much better  Neurological: Negative for dizziness, light-headedness, numbness and headaches.    History Past Medical History:  Diagnosis Date  . Anemia    as teenager  . Anxiety   . Arthritis   . Depression   . Fibroid   . Headache(784.0)   . RA (rheumatoid arthritis) ([place])     She has a past surgical history that includes Abdominal surgery and Appendectomy.   Her family history includes Arthritis in her maternal grandmother and mother; Breast cancer in her sister; Depression in her sister; Hyperlipidemia in her mother and sister; Hypertension in her mother and sister; Kidney disease in her sister; Mental retardation in her son; Stroke in her mother.She reports that she has quit smoking. Her smoking use included cigarettes. She started smoking about 37 years ago. She smoked 1.00 pack per day. She has quit using smokeless tobacco. She reports that she does not drink alcohol or use drugs.  Current Outpatient Medications on File Prior to Visit  Medication Sig Dispense Refill  . Adalimumab 40 MG/0.8ML PNKT Inject 40 mg into the skin once a week. On Fridays.    .  ALPRAZolam (XANAX) 1 MG tablet Take 1 tablet (1 mg total) by mouth 2 (two) times daily as needed for anxiety. 60 tablet 0  . atorvastatin (LIPITOR) 10 MG tablet Take 1 tablet (10 mg total) by mouth at bedtime. 90 tablet 1  . lisinopril-hydrochlorothiazide (PRINZIDE,ZESTORETIC) 20-12.5 MG tablet Take 1 tablet by mouth daily.    Marland Kitchen oxyCODONE (ROXICODONE) 15 MG immediate release tablet Take 1 tablet (15 mg total) by mouth every 4 (four) hours as needed for pain. 60 tablet 0  . sulfaSALAzine (AZULFIDINE) 500 MG tablet Take 1,000 mg by mouth 2 (two) times daily.  0   No current facility-administered medications on file prior to visit.      Objective:  Objective  Physical Exam Vitals signs and nursing note reviewed.  Constitutional:      Appearance: She is well-developed.  HENT:     Head: Normocephalic and atraumatic.  Eyes:     Conjunctiva/sclera: Conjunctivae normal.  Neck:     Musculoskeletal: Normal range of motion and neck supple.     Thyroid: No thyromegaly.     Vascular: No carotid bruit or JVD.  Cardiovascular:     Rate and Rhythm: Normal rate and regular rhythm.     Heart sounds: Normal heart sounds. No murmur.  Pulmonary:     Effort: Pulmonary effort is normal. No respiratory distress.     Breath sounds: Normal breath sounds. No wheezing or rales.  Chest:     Chest wall: No tenderness.  Neurological:     Mental Status:  She is alert and oriented to person, place, and time.    BP 112/74 (BP Location: Right Arm, Patient Position: Sitting, Cuff Size: Large)   Pulse 96   Ht 5\' 2"  (1.575 m)   Wt 242 lb (109.8 kg)   LMP 10/07/2013   SpO2 98%   BMI 44.26 kg/m  Wt Readings from Last 3 Encounters:  03/24/18 242 lb (109.8 kg)  12/20/17 238 lb (108 kg)  03/27/16 235 lb (106.6 kg)     Lab Results  Component Value Date   WBC 5.9 03/27/2016   HGB 14.0 03/27/2016   HCT 38.3 03/27/2016   PLT 295 03/27/2016   GLUCOSE 83 12/20/2017   CHOL 205 (H) 12/20/2017   TRIG 81.0  12/20/2017   HDL 67.10 12/20/2017   LDLCALC 122 (H) 12/20/2017   ALT 30 12/20/2017   AST 27 12/20/2017   NA 138 12/20/2017   K 3.6 12/20/2017   CL 101 12/20/2017   CREATININE 0.67 12/20/2017   BUN 11 12/20/2017   CO2 28 12/20/2017   TSH 1.901 01/06/2014    Mm 3d Screen Breast Bilateral  Result Date: 01/17/2018 CLINICAL DATA:  Screening. EXAM: DIGITAL SCREENING BILATERAL MAMMOGRAM WITH TOMO AND CAD COMPARISON:  Previous exam(s). ACR Breast Density Category b: There are scattered areas of fibroglandular density. FINDINGS: There are no findings suspicious for malignancy. Images were processed with CAD. IMPRESSION: No mammographic evidence of malignancy. A result letter of this screening mammogram will be mailed directly to the patient. RECOMMENDATION: Screening mammogram in one year. (Code:SM-B-01Y) BI-RADS CATEGORY  1: Negative. Electronically Signed   By: Margarette Canada M.D.   On: 01/17/2018 14:38     Assessment & Plan:  Plan  I am having Debra Everett maintain her lisinopril-hydrochlorothiazide, Adalimumab, sulfaSALAzine, atorvastatin, oxyCODONE, and ALPRAZolam.  No orders of the defined types were placed in this encounter.   Problem List Items Addressed This Visit      Unprioritized   Essential hypertension    Well controlled, no changes to meds. Encouraged heart healthy diet such as the DASH diet and exercise as tolerated.       Relevant Orders   Lipid panel   Comprehensive metabolic panel   Hyperlipidemia - Primary    Tolerating statin, encouraged heart healthy diet, avoid trans fats, minimize simple carbs and saturated fats. Increase exercise as tolerated      Relevant Orders   Lipid panel   Comprehensive metabolic panel   Rheumatoid arthritis involving multiple sites with positive rheumatoid factor [facility])    Per rheumatology Pain controlled with enbrel  And pain meds Pain clinic pending         Follow-up: Return in about 6 months (around 09/22/2018), or if  symptoms worsen or fail to improve.  Ann Held, DO

## 2018-03-24 NOTE — Patient Instructions (Signed)

## 2018-03-24 NOTE — Assessment & Plan Note (Signed)
Well controlled, no changes to meds. Encouraged heart healthy diet such as the DASH diet and exercise as tolerated.  °

## 2018-03-24 NOTE — Assessment & Plan Note (Addendum)
Per rheumatology Pain controlled with enbrel  And pain meds Pain clinic pending

## 2018-03-26 DIAGNOSIS — G8929 Other chronic pain: Secondary | ICD-10-CM | POA: Diagnosis not present

## 2018-03-26 DIAGNOSIS — Z79899 Other long term (current) drug therapy: Secondary | ICD-10-CM | POA: Diagnosis not present

## 2018-03-26 DIAGNOSIS — M0520 Rheumatoid vasculitis with rheumatoid arthritis of unspecified site: Secondary | ICD-10-CM | POA: Diagnosis not present

## 2018-03-26 DIAGNOSIS — M545 Low back pain: Secondary | ICD-10-CM | POA: Diagnosis not present

## 2018-03-28 ENCOUNTER — Ambulatory Visit: Payer: Medicare Other | Admitting: Family Medicine

## 2018-03-31 ENCOUNTER — Encounter: Payer: Self-pay | Admitting: *Deleted

## 2018-03-31 ENCOUNTER — Other Ambulatory Visit: Payer: Self-pay | Admitting: Family Medicine

## 2018-03-31 DIAGNOSIS — E785 Hyperlipidemia, unspecified: Secondary | ICD-10-CM

## 2018-03-31 DIAGNOSIS — I10 Essential (primary) hypertension: Secondary | ICD-10-CM

## 2018-03-31 NOTE — Progress Notes (Signed)
lipi

## 2018-04-11 ENCOUNTER — Other Ambulatory Visit: Payer: Self-pay | Admitting: Family Medicine

## 2018-04-11 MED ORDER — ALPRAZOLAM 1 MG PO TABS: 1.0000 mg | ORAL_TABLET | Freq: Two times a day (BID) | ORAL | 0 refills | Status: DC | PRN

## 2018-04-11 NOTE — Telephone Encounter (Signed)
Pt is requesting refill on alprazolam.   Last OV: 03/24/2018 Last Fill : 03/17/2018 #60 and 0RF UDS: 12/22/2017 Moderate risk

## 2018-04-23 DIAGNOSIS — G8929 Other chronic pain: Secondary | ICD-10-CM | POA: Diagnosis not present

## 2018-04-23 DIAGNOSIS — Z79899 Other long term (current) drug therapy: Secondary | ICD-10-CM | POA: Diagnosis not present

## 2018-04-23 DIAGNOSIS — M545 Low back pain: Secondary | ICD-10-CM | POA: Diagnosis not present

## 2018-05-08 ENCOUNTER — Telehealth: Payer: Self-pay

## 2018-05-08 ENCOUNTER — Other Ambulatory Visit: Payer: Self-pay | Admitting: Family Medicine

## 2018-05-08 MED ORDER — ALPRAZOLAM 1 MG PO TABS: 1.0000 mg | ORAL_TABLET | Freq: Two times a day (BID) | ORAL | 0 refills | Status: DC | PRN

## 2018-05-08 NOTE — Telephone Encounter (Signed)
Copied from [place] [phone]. Topic: General - Other >> May 08, 2018 11:52 AM Celene Kras A wrote: Reason for CRM: Pt called stating she is trying to wean herself off of Xanax. Pt is requesting at the time of her next refill she only be sent 30 tablets instead of 60. Pt states she has been breaking them in half. Please advise.

## 2018-05-08 NOTE — Telephone Encounter (Signed)
Happy to do that --- made change to med list

## 2018-05-12 ENCOUNTER — Other Ambulatory Visit: Payer: Self-pay | Admitting: Family Medicine

## 2018-05-12 MED ORDER — ALPRAZOLAM 1 MG PO TABS: 1.0000 mg | ORAL_TABLET | Freq: Two times a day (BID) | ORAL | 1 refills | Status: DC | PRN

## 2018-05-12 NOTE — Telephone Encounter (Signed)
Pt called back in and stated that the pharmacy has not rec'd this med? Can this be resent or refaxed .  [facility]   Best number [phone]

## 2018-05-12 NOTE — Telephone Encounter (Signed)
i'm sorry --- I did not realize she needed it now--- I just changed it on the med list so it would be sent that way next time.   I sent it in now

## 2018-05-13 NOTE — Telephone Encounter (Signed)
Medication sent in and patient notified. 

## 2018-05-20 DIAGNOSIS — Z7189 Other specified counseling: Secondary | ICD-10-CM | POA: Diagnosis not present

## 2018-05-20 DIAGNOSIS — E559 Vitamin D deficiency, unspecified: Secondary | ICD-10-CM | POA: Diagnosis not present

## 2018-05-20 DIAGNOSIS — Z7952 Long term (current) use of systemic steroids: Secondary | ICD-10-CM | POA: Diagnosis not present

## 2018-05-20 DIAGNOSIS — M0579 Rheumatoid arthritis with rheumatoid factor of multiple sites without organ or systems involvement: Secondary | ICD-10-CM | POA: Diagnosis not present

## 2018-05-20 DIAGNOSIS — Z79899 Other long term (current) drug therapy: Secondary | ICD-10-CM | POA: Diagnosis not present

## 2018-05-21 DIAGNOSIS — M0579 Rheumatoid arthritis with rheumatoid factor of multiple sites without organ or systems involvement: Secondary | ICD-10-CM | POA: Diagnosis not present

## 2018-05-21 DIAGNOSIS — E559 Vitamin D deficiency, unspecified: Secondary | ICD-10-CM | POA: Diagnosis not present

## 2018-05-23 DIAGNOSIS — M545 Low back pain: Secondary | ICD-10-CM | POA: Diagnosis not present

## 2018-05-23 DIAGNOSIS — G8929 Other chronic pain: Secondary | ICD-10-CM | POA: Diagnosis not present

## 2018-05-23 DIAGNOSIS — Z9189 Other specified personal risk factors, not elsewhere classified: Secondary | ICD-10-CM | POA: Diagnosis not present

## 2018-05-23 DIAGNOSIS — Z79899 Other long term (current) drug therapy: Secondary | ICD-10-CM | POA: Diagnosis not present

## 2018-06-08 ENCOUNTER — Other Ambulatory Visit: Payer: Self-pay | Admitting: Family Medicine

## 2018-06-08 DIAGNOSIS — G894 Chronic pain syndrome: Secondary | ICD-10-CM

## 2018-06-08 DIAGNOSIS — M0579 Rheumatoid arthritis with rheumatoid factor of multiple sites without organ or systems involvement: Secondary | ICD-10-CM

## 2018-06-20 DIAGNOSIS — G8929 Other chronic pain: Secondary | ICD-10-CM | POA: Diagnosis not present

## 2018-06-20 DIAGNOSIS — M545 Low back pain: Secondary | ICD-10-CM | POA: Diagnosis not present

## 2018-06-20 DIAGNOSIS — Z9189 Other specified personal risk factors, not elsewhere classified: Secondary | ICD-10-CM | POA: Diagnosis not present

## 2018-07-01 ENCOUNTER — Other Ambulatory Visit: Payer: Self-pay | Admitting: Family Medicine

## 2018-07-02 NOTE — Telephone Encounter (Signed)
Last written: 05/12/18 Last ov: 03/24/18  Next ov: 09/22/18 Contract: 12/20/17 UDS: 12/21/18

## 2018-07-18 DIAGNOSIS — Z79899 Other long term (current) drug therapy: Secondary | ICD-10-CM | POA: Diagnosis not present

## 2018-07-18 DIAGNOSIS — M545 Low back pain: Secondary | ICD-10-CM | POA: Diagnosis not present

## 2018-07-18 DIAGNOSIS — G8929 Other chronic pain: Secondary | ICD-10-CM | POA: Diagnosis not present

## 2018-08-14 ENCOUNTER — Other Ambulatory Visit: Payer: Self-pay

## 2018-08-14 NOTE — Patient Outreach (Signed)
Debra Everett) Care Management  08/14/2018  Debra Everett Jun 03, 1960 [id number]   Medication Adherence call to Mrs. Debra Everett [facility] Identifiers Verify spoke with patient she is past due on Lisinopril /Hctz 20/12.5 mg patient explain she is no longer taking this medication doctor took her off patient will finished with what she have and then she will be off this medication. Debra Everett is showing past due under [place].   [place] Management Direct Dial [phone]  Fax [phone] [email or web address]

## 2018-08-15 ENCOUNTER — Other Ambulatory Visit: Payer: Self-pay | Admitting: Family Medicine

## 2018-08-15 DIAGNOSIS — G8929 Other chronic pain: Secondary | ICD-10-CM | POA: Diagnosis not present

## 2018-08-15 DIAGNOSIS — Z79899 Other long term (current) drug therapy: Secondary | ICD-10-CM | POA: Diagnosis not present

## 2018-08-15 DIAGNOSIS — M545 Low back pain: Secondary | ICD-10-CM | POA: Diagnosis not present

## 2018-08-15 NOTE — Telephone Encounter (Signed)
Requesting: Xanax Contract: 12/20/2017 UDS: 12/20/2017 Last OV: 03/24/2018 Next OV: 09/22/2018 Last Refill: 07/01/3028, #30--1 RF Database:   Please advise

## 2018-08-19 DIAGNOSIS — Z79899 Other long term (current) drug therapy: Secondary | ICD-10-CM | POA: Diagnosis not present

## 2018-08-19 DIAGNOSIS — Z7189 Other specified counseling: Secondary | ICD-10-CM | POA: Diagnosis not present

## 2018-08-19 DIAGNOSIS — Z7952 Long term (current) use of systemic steroids: Secondary | ICD-10-CM | POA: Diagnosis not present

## 2018-08-19 DIAGNOSIS — E559 Vitamin D deficiency, unspecified: Secondary | ICD-10-CM | POA: Diagnosis not present

## 2018-08-19 DIAGNOSIS — M0579 Rheumatoid arthritis with rheumatoid factor of multiple sites without organ or systems involvement: Secondary | ICD-10-CM | POA: Diagnosis not present

## 2018-09-12 DIAGNOSIS — G8929 Other chronic pain: Secondary | ICD-10-CM | POA: Diagnosis not present

## 2018-09-12 DIAGNOSIS — Z79899 Other long term (current) drug therapy: Secondary | ICD-10-CM | POA: Diagnosis not present

## 2018-09-12 DIAGNOSIS — M545 Low back pain: Secondary | ICD-10-CM | POA: Diagnosis not present

## 2018-09-22 ENCOUNTER — Other Ambulatory Visit: Payer: Self-pay

## 2018-09-22 ENCOUNTER — Ambulatory Visit (INDEPENDENT_AMBULATORY_CARE_PROVIDER_SITE_OTHER): Payer: Medicare Other | Admitting: Family Medicine

## 2018-09-22 ENCOUNTER — Encounter: Payer: Self-pay | Admitting: Family Medicine

## 2018-09-22 DIAGNOSIS — M0579 Rheumatoid arthritis with rheumatoid factor of multiple sites without organ or systems involvement: Secondary | ICD-10-CM | POA: Diagnosis not present

## 2018-09-22 DIAGNOSIS — E785 Hyperlipidemia, unspecified: Secondary | ICD-10-CM | POA: Diagnosis not present

## 2018-09-22 DIAGNOSIS — F419 Anxiety disorder, unspecified: Secondary | ICD-10-CM | POA: Diagnosis not present

## 2018-09-22 DIAGNOSIS — I10 Essential (primary) hypertension: Secondary | ICD-10-CM

## 2018-09-22 MED ORDER — LISINOPRIL-HYDROCHLOROTHIAZIDE 20-12.5 MG PO TABS: ORAL_TABLET | ORAL | 2 refills | Status: DC

## 2018-09-22 MED ORDER — ZINC GLUCONATE 50 MG PO TABS: 50.0000 mg | ORAL_TABLET | Freq: Every day | ORAL | Status: AC

## 2018-09-22 MED ORDER — VITAMIN A-BETA CAROTENE 25000 UNITS PO CAPS: 25000.0000 [IU] | ORAL_CAPSULE | Freq: Every day | ORAL | Status: AC

## 2018-09-22 MED ORDER — VITAMIN E 45 MG (100 UNIT) PO CAPS: 100.0000 [IU] | ORAL_CAPSULE | Freq: Every day | ORAL | Status: AC

## 2018-09-22 MED ORDER — HEALTHY EYES PO TABS: 1.0000 | ORAL_TABLET | Freq: Every day | ORAL | Status: AC

## 2018-09-22 MED ORDER — ALPRAZOLAM 1 MG PO TABS: 1.0000 mg | ORAL_TABLET | Freq: Two times a day (BID) | ORAL | 1 refills | Status: DC | PRN

## 2018-09-22 MED ORDER — BETA CAROTENE 25000 UNITS PO CAPS: 25000.0000 [IU] | ORAL_CAPSULE | Freq: Every day | ORAL | Status: AC

## 2018-09-22 MED ORDER — LISINOPRIL-HYDROCHLOROTHIAZIDE 20-12.5 MG PO TABS: 1.0000 | ORAL_TABLET | Freq: Every day | ORAL | 2 refills | Status: DC

## 2018-09-22 NOTE — Progress Notes (Signed)
Virtual Visit via Video Note  I connected with Debra Everett on 09/22/18 at  8:40 AM EDT by a video enabled telemedicine application and verified that I am speaking with the correct person using two identifiers.  Location: Patient: home Provider: office    I discussed the limitations of evaluation and management by telemedicine and the availability of in person appointments. The patient expressed understanding and agreed to proceed.  History of Present Illness: Pt is home and needs f/u anxiety and bp and cholesterol Pt is seeing rheumatology  No new complaints   Past Medical History:  Diagnosis Date  . Anemia    as teenager  . Anxiety   . Arthritis   . Depression   . Fibroid   . Headache(784.0)   . RA (rheumatoid arthritis) ([place])    Current Outpatient Medications on File Prior to Visit  Medication Sig Dispense Refill  . atorvastatin (LIPITOR) 10 MG tablet TAKE 1 TABLET BY MOUTH EVERYDAY AT BEDTIME 90 tablet 1  . etanercept (ENBREL SURECLICK) 50 MG/ML injection INJECT THE CONTENTS OF 1 PEN INTO THE SKIN EVERY 7 DAYS.    Marland Kitchen oxyCODONE (ROXICODONE) 15 MG immediate release tablet Take 1 tablet (15 mg total) by mouth every 4 (four) hours as needed for pain. 60 tablet 0  . sulfaSALAzine (AZULFIDINE) 500 MG tablet Take 1,000 mg by mouth 2 (two) times daily.  0  . Adalimumab 40 MG/0.8ML PNKT Inject 40 mg into the skin once a week. On Fridays.     No current facility-administered medications on file prior to visit.     Observations/Objective: .119/76 96,  Afebrile Pt in NAD Assessment and Plan: 1. Anxiety  - ALPRAZolam (XANAX) 1 MG tablet; Take 1 tablet (1 mg total) by mouth 2 (two) times daily as needed for anxiety.  Dispense: 30 tablet; Refill: 1  2. Essential hypertension Well controlled, no changes to meds. Encouraged heart healthy diet such as the DASH diet and exercise as tolerated.  - Lipid panel; Future - Comprehensive metabolic panel; Future -  lisinopril-hydrochlorothiazide (ZESTORETIC) 20-12.5 MG tablet; 1/2 po qd  Dispense: 45 tablet; Refill: 2  3. Hyperlipidemia, unspecified hyperlipidemia type Encouraged heart healthy diet, increase exercise, avoid trans fats, consider a krill oil cap daily - Lipid panel; Future - Comprehensive metabolic panel; Future  4. Rheumatoid arthritis involving multiple sites with positive rheumatoid factor (HCC) Per rheum - zinc gluconate 50 MG tablet; Take 1 tablet (50 mg total) by mouth daily.  Dispense:   - Beta Carotene (VITAMIN A) 25000 UNIT capsule; Take 1 capsule (25,000 Units total) by mouth daily.  Dispense:   - vitamin E 100 UNIT capsule; Take 1 capsule (100 Units total) by mouth daily.     I discussed the assessment and treatment plan with the patient. The patient was provided an opportunity to ask questions and all were answered. The patient agreed with the plan and demonstrated an understanding of the instructions.   The patient was advised to call back or seek an in-person evaluation if the symptoms worsen or if the condition fails to improve as anticipated.  I provided 15 minutes of non-face-to-face time during this encounter.   Ann Held, DO

## 2018-09-25 ENCOUNTER — Other Ambulatory Visit: Payer: Self-pay

## 2018-09-25 ENCOUNTER — Other Ambulatory Visit (INDEPENDENT_AMBULATORY_CARE_PROVIDER_SITE_OTHER): Payer: Medicare Other

## 2018-09-25 DIAGNOSIS — I10 Essential (primary) hypertension: Secondary | ICD-10-CM

## 2018-09-25 DIAGNOSIS — E785 Hyperlipidemia, unspecified: Secondary | ICD-10-CM

## 2018-09-25 LAB — LIPID PANEL
Cholesterol: 157 mg/dL (ref 0–200)
HDL: 46.4 mg/dL (ref >39.00)
LDL Cholesterol: 87 mg/dL (ref 0–99)
NonHDL: 110.22
Total CHOL/HDL Ratio: 3
Triglycerides: 116 mg/dL (ref 0.0–149.0)
VLDL: 23.2 mg/dL (ref 0.0–40.0)

## 2018-09-25 LAB — COMPREHENSIVE METABOLIC PANEL
ALT: 33 U/L (ref 0–35)
AST: 30 U/L (ref 0–37)
Albumin: 4.3 g/dL (ref 3.5–5.2)
Alkaline Phosphatase: 79 U/L (ref 39–117)
BUN: 10 mg/dL (ref 6–23)
CO2: 28 mEq/L (ref 19–32)
Calcium: 10.2 mg/dL (ref 8.4–10.5)
Chloride: 104 mEq/L (ref 96–112)
Creatinine, Ser: 0.6 mg/dL (ref 0.40–1.20)
GFR: 125.09 mL/min (ref >60.00)
Glucose, Bld: 84 mg/dL (ref 70–99)
Potassium: 3.7 mEq/L (ref 3.5–5.1)
Sodium: 138 mEq/L (ref 135–145)
Total Bilirubin: 0.6 mg/dL (ref 0.2–1.2)
Total Protein: 7.8 g/dL (ref 6.0–8.3)

## 2018-10-08 DIAGNOSIS — M0579 Rheumatoid arthritis with rheumatoid factor of multiple sites without organ or systems involvement: Secondary | ICD-10-CM | POA: Diagnosis not present

## 2018-10-08 DIAGNOSIS — M5416 Radiculopathy, lumbar region: Secondary | ICD-10-CM | POA: Diagnosis not present

## 2018-10-08 DIAGNOSIS — G894 Chronic pain syndrome: Secondary | ICD-10-CM | POA: Diagnosis not present

## 2018-10-08 DIAGNOSIS — E559 Vitamin D deficiency, unspecified: Secondary | ICD-10-CM | POA: Diagnosis not present

## 2018-10-08 DIAGNOSIS — Z7189 Other specified counseling: Secondary | ICD-10-CM | POA: Diagnosis not present

## 2018-10-13 DIAGNOSIS — B028 Zoster with other complications: Secondary | ICD-10-CM | POA: Diagnosis not present

## 2018-10-13 DIAGNOSIS — M545 Low back pain: Secondary | ICD-10-CM | POA: Diagnosis not present

## 2018-10-13 DIAGNOSIS — G8929 Other chronic pain: Secondary | ICD-10-CM | POA: Diagnosis not present

## 2018-10-13 DIAGNOSIS — Z79899 Other long term (current) drug therapy: Secondary | ICD-10-CM | POA: Diagnosis not present

## 2018-10-20 DIAGNOSIS — B028 Zoster with other complications: Secondary | ICD-10-CM | POA: Diagnosis not present

## 2018-10-20 DIAGNOSIS — G8929 Other chronic pain: Secondary | ICD-10-CM | POA: Diagnosis not present

## 2018-10-20 DIAGNOSIS — L308 Other specified dermatitis: Secondary | ICD-10-CM | POA: Diagnosis not present

## 2018-10-20 DIAGNOSIS — M545 Low back pain: Secondary | ICD-10-CM | POA: Diagnosis not present

## 2018-10-24 ENCOUNTER — Other Ambulatory Visit: Payer: Self-pay | Admitting: Family Medicine

## 2018-10-24 DIAGNOSIS — F419 Anxiety disorder, unspecified: Secondary | ICD-10-CM

## 2018-10-24 NOTE — Telephone Encounter (Signed)
Last written: Last ov: 09/22/18 Next ov: 04/02/19 Contract: will get at next visit UDS: will get at next visit

## 2018-10-28 DIAGNOSIS — L308 Other specified dermatitis: Secondary | ICD-10-CM | POA: Diagnosis not present

## 2018-10-28 DIAGNOSIS — B028 Zoster with other complications: Secondary | ICD-10-CM | POA: Diagnosis not present

## 2018-11-12 DIAGNOSIS — M545 Low back pain: Secondary | ICD-10-CM | POA: Diagnosis not present

## 2018-11-12 DIAGNOSIS — E559 Vitamin D deficiency, unspecified: Secondary | ICD-10-CM | POA: Diagnosis not present

## 2018-11-12 DIAGNOSIS — G8929 Other chronic pain: Secondary | ICD-10-CM | POA: Diagnosis not present

## 2018-11-12 DIAGNOSIS — Z79899 Other long term (current) drug therapy: Secondary | ICD-10-CM | POA: Diagnosis not present

## 2018-11-12 DIAGNOSIS — Z1159 Encounter for screening for other viral diseases: Secondary | ICD-10-CM | POA: Diagnosis not present

## 2018-11-25 DIAGNOSIS — E559 Vitamin D deficiency, unspecified: Secondary | ICD-10-CM | POA: Diagnosis not present

## 2018-11-25 DIAGNOSIS — M0579 Rheumatoid arthritis with rheumatoid factor of multiple sites without organ or systems involvement: Secondary | ICD-10-CM | POA: Diagnosis not present

## 2018-11-25 DIAGNOSIS — G894 Chronic pain syndrome: Secondary | ICD-10-CM | POA: Diagnosis not present

## 2018-11-25 DIAGNOSIS — Z7189 Other specified counseling: Secondary | ICD-10-CM | POA: Diagnosis not present

## 2018-11-25 DIAGNOSIS — M5416 Radiculopathy, lumbar region: Secondary | ICD-10-CM | POA: Diagnosis not present

## 2018-11-27 ENCOUNTER — Other Ambulatory Visit: Payer: Self-pay | Admitting: Family Medicine

## 2018-11-27 DIAGNOSIS — G894 Chronic pain syndrome: Secondary | ICD-10-CM

## 2018-11-27 DIAGNOSIS — M0579 Rheumatoid arthritis with rheumatoid factor of multiple sites without organ or systems involvement: Secondary | ICD-10-CM

## 2018-11-28 ENCOUNTER — Other Ambulatory Visit: Payer: Self-pay | Admitting: Family Medicine

## 2018-11-28 DIAGNOSIS — F419 Anxiety disorder, unspecified: Secondary | ICD-10-CM

## 2018-12-01 NOTE — Telephone Encounter (Signed)
Last written: 10/24/18 Last ov: 09/22/18 Next ov: 04/02/19 Contract: will get at next visit UDS: will get at next visit

## 2018-12-02 DIAGNOSIS — I10 Essential (primary) hypertension: Secondary | ICD-10-CM | POA: Diagnosis not present

## 2018-12-02 DIAGNOSIS — N951 Menopausal and female climacteric states: Secondary | ICD-10-CM | POA: Diagnosis not present

## 2018-12-02 DIAGNOSIS — Z1159 Encounter for screening for other viral diseases: Secondary | ICD-10-CM | POA: Diagnosis not present

## 2018-12-02 DIAGNOSIS — E7800 Pure hypercholesterolemia, unspecified: Secondary | ICD-10-CM | POA: Diagnosis not present

## 2018-12-02 DIAGNOSIS — R0602 Shortness of breath: Secondary | ICD-10-CM | POA: Diagnosis not present

## 2018-12-02 DIAGNOSIS — Z1389 Encounter for screening for other disorder: Secondary | ICD-10-CM | POA: Diagnosis not present

## 2018-12-02 DIAGNOSIS — R7303 Prediabetes: Secondary | ICD-10-CM | POA: Diagnosis not present

## 2018-12-02 DIAGNOSIS — Z0000 Encounter for general adult medical examination without abnormal findings: Secondary | ICD-10-CM | POA: Diagnosis not present

## 2018-12-02 DIAGNOSIS — D539 Nutritional anemia, unspecified: Secondary | ICD-10-CM | POA: Diagnosis not present

## 2018-12-02 DIAGNOSIS — M129 Arthropathy, unspecified: Secondary | ICD-10-CM | POA: Diagnosis not present

## 2018-12-02 DIAGNOSIS — R5383 Other fatigue: Secondary | ICD-10-CM | POA: Diagnosis not present

## 2018-12-11 DIAGNOSIS — E041 Nontoxic single thyroid nodule: Secondary | ICD-10-CM | POA: Diagnosis not present

## 2018-12-12 DIAGNOSIS — G8929 Other chronic pain: Secondary | ICD-10-CM | POA: Diagnosis not present

## 2018-12-12 DIAGNOSIS — M545 Low back pain: Secondary | ICD-10-CM | POA: Diagnosis not present

## 2018-12-12 DIAGNOSIS — Z79899 Other long term (current) drug therapy: Secondary | ICD-10-CM | POA: Diagnosis not present

## 2018-12-15 ENCOUNTER — Other Ambulatory Visit: Payer: Self-pay | Admitting: Family Medicine

## 2018-12-15 DIAGNOSIS — Z1231 Encounter for screening mammogram for malignant neoplasm of breast: Secondary | ICD-10-CM

## 2018-12-16 DIAGNOSIS — Z1159 Encounter for screening for other viral diseases: Secondary | ICD-10-CM | POA: Diagnosis not present

## 2018-12-16 DIAGNOSIS — R7303 Prediabetes: Secondary | ICD-10-CM | POA: Diagnosis not present

## 2018-12-16 DIAGNOSIS — M069 Rheumatoid arthritis, unspecified: Secondary | ICD-10-CM | POA: Diagnosis not present

## 2018-12-16 DIAGNOSIS — I10 Essential (primary) hypertension: Secondary | ICD-10-CM | POA: Diagnosis not present

## 2018-12-16 DIAGNOSIS — E7800 Pure hypercholesterolemia, unspecified: Secondary | ICD-10-CM | POA: Diagnosis not present

## 2018-12-16 DIAGNOSIS — R945 Abnormal results of liver function studies: Secondary | ICD-10-CM | POA: Diagnosis not present

## 2018-12-16 DIAGNOSIS — Z03818 Encounter for observation for suspected exposure to other biological agents ruled out: Secondary | ICD-10-CM | POA: Diagnosis not present

## 2018-12-22 DIAGNOSIS — R0602 Shortness of breath: Secondary | ICD-10-CM | POA: Diagnosis not present

## 2019-01-07 ENCOUNTER — Other Ambulatory Visit: Payer: Self-pay | Admitting: Family Medicine

## 2019-01-07 DIAGNOSIS — F419 Anxiety disorder, unspecified: Secondary | ICD-10-CM

## 2019-01-09 NOTE — Telephone Encounter (Signed)
Requesting: Alprazolam 1mg  UDS: 12/20/2017 Last Visit: 09/22/2018 Next Visit: 04/02/2019 Last Refill: 12/01/2018, #30, 1 RF  Please Advise

## 2019-01-20 ENCOUNTER — Other Ambulatory Visit: Payer: Self-pay | Admitting: Family Medicine

## 2019-01-20 DIAGNOSIS — I10 Essential (primary) hypertension: Secondary | ICD-10-CM

## 2019-01-28 ENCOUNTER — Ambulatory Visit: Payer: Medicare Other | Admitting: Obstetrics & Gynecology

## 2019-02-09 ENCOUNTER — Ambulatory Visit: Payer: Medicare Other

## 2019-02-10 ENCOUNTER — Ambulatory Visit: Payer: Medicare Other | Admitting: Obstetrics and Gynecology

## 2019-02-10 ENCOUNTER — Other Ambulatory Visit: Payer: Self-pay | Admitting: Family Medicine

## 2019-02-10 DIAGNOSIS — F419 Anxiety disorder, unspecified: Secondary | ICD-10-CM

## 2019-02-11 NOTE — Telephone Encounter (Signed)
Requesting: Xanax Contract: 12/20/2017 UDS: 12/20/2017 Last OV: 09/22/2018 Next OV: 04/02/2019 Last Refill: 01/09/19, #30--1 RF Database:   Please advise

## 2019-03-09 ENCOUNTER — Ambulatory Visit: Payer: Medicare Other

## 2019-03-16 ENCOUNTER — Other Ambulatory Visit: Payer: Self-pay | Admitting: Family Medicine

## 2019-03-16 DIAGNOSIS — F419 Anxiety disorder, unspecified: Secondary | ICD-10-CM

## 2019-03-16 NOTE — Telephone Encounter (Signed)
Requesting:   Alprazolam Contract:   [place] on 12/20/2017 UDS:   UDS on 12/20/2017 Last Visit:   09/22/2018 Next Visit:    04/02/2019 Last Refill:   02/12/2019---#30 with 1 refill  Please Advise

## 2019-04-02 ENCOUNTER — Encounter: Payer: Medicare Other | Admitting: Family Medicine

## 2019-04-09 ENCOUNTER — Encounter: Payer: Self-pay | Admitting: Family Medicine

## 2019-04-09 ENCOUNTER — Other Ambulatory Visit: Payer: Self-pay | Admitting: Family Medicine

## 2019-04-09 DIAGNOSIS — F419 Anxiety disorder, unspecified: Secondary | ICD-10-CM

## 2019-04-09 MED ORDER — ALPRAZOLAM 0.5 MG PO TABS: ORAL_TABLET | ORAL | 0 refills | Status: AC

## 2019-04-09 NOTE — Telephone Encounter (Signed)
I changed It to 0.5 mg and #30  1 a day prn

## 2019-04-10 ENCOUNTER — Other Ambulatory Visit: Payer: Self-pay

## 2019-04-10 ENCOUNTER — Ambulatory Visit (INDEPENDENT_AMBULATORY_CARE_PROVIDER_SITE_OTHER): Payer: Medicare Other | Admitting: Obstetrics and Gynecology

## 2019-04-10 ENCOUNTER — Encounter: Payer: Self-pay | Admitting: Obstetrics and Gynecology

## 2019-04-10 ENCOUNTER — Other Ambulatory Visit (HOSPITAL_COMMUNITY)
Admission: RE | Admit: 2019-04-10 | Discharge: 2019-04-10 | Disposition: A | Discharge status: Home / Self Care | Payer: Medicare Other | Source: Ambulatory Visit | Attending: Obstetrics and Gynecology | Admitting: Obstetrics and Gynecology

## 2019-04-10 VITALS — BP 122/84 | HR 102 | Ht 62.0 in | Wt 259.0 lb

## 2019-04-10 DIAGNOSIS — Z1151 Encounter for screening for human papillomavirus (HPV): Secondary | ICD-10-CM | POA: Diagnosis not present

## 2019-04-10 DIAGNOSIS — Z124 Encounter for screening for malignant neoplasm of cervix: Secondary | ICD-10-CM

## 2019-04-10 DIAGNOSIS — Z780 Asymptomatic menopausal state: Secondary | ICD-10-CM | POA: Diagnosis not present

## 2019-04-10 DIAGNOSIS — Z01419 Encounter for gynecological examination (general) (routine) without abnormal findings: Secondary | ICD-10-CM | POA: Insufficient documentation

## 2019-04-10 NOTE — Progress Notes (Signed)
Debra Everett is a 59 y.o. [phone] female here for a routine annual gynecologic exam.  Current complaints: none.   Denies abnormal vaginal bleeding, discharge, pelvic pain, problems with intercourse or other gynecologic concerns.    Gynecologic History Patient's last menstrual period was 10/07/2013. Contraception: post menopausal status Last Pap: 2017. Results were: normal Last mammogram: Uncertain, scheduled next week by PCP  Obstetric History OB History  Gravida Para Term Preterm AB Living  3 3 3  0 0 2  SAB TAB Ectopic Multiple Live Births  0 0 0 0      # Outcome Date GA Lbr Len/2nd Weight Sex Delivery Anes PTL Lv  3 Term     F Vag-Spont     2 Term     M Vag-Spont        Birth Comments: breech  1 Term     M Vag-Spont       Past Medical History:  Diagnosis Date  . Anemia    as teenager  . Anxiety   . Arthritis   . Depression   . Fibroid   . Headache(784.0)   . RA (rheumatoid arthritis) ([place])     Past Surgical History:  Procedure Laterality Date  . ABDOMINAL SURGERY    . APPENDECTOMY      Current Outpatient Medications on File Prior to Visit  Medication Sig Dispense Refill  . ALPRAZolam (XANAX) 0.5 MG tablet 1 po qd prn 30 tablet 0  . atorvastatin (LIPITOR) 10 MG tablet TAKE 1 TABLET BY MOUTH EVERYDAY AT BEDTIME 90 tablet 1  . Beta Carotene (VITAMIN A) 25000 UNIT capsule Take 1 capsule (25,000 Units total) by mouth daily.    . beta carotene 25000 UNIT capsule Take 1 capsule (25,000 Units total) by mouth daily. 3 capsule   . lisinopril-hydrochlorothiazide (ZESTORETIC) 20-12.5 MG tablet TAKE 1 TABLET BY MOUTH EVERY DAY 90 tablet 1  . Multiple Vitamins-Minerals (HEALTHY EYES) TABS Take 1 tablet by mouth daily.    Marland Kitchen oxyCODONE (ROXICODONE) 15 MG immediate release tablet Take 1 tablet (15 mg total) by mouth every 4 (four) hours as needed for pain. 60 tablet 0  . sulfaSALAzine (AZULFIDINE) 500 MG tablet Take 1,000 mg by mouth 2 (two) times daily.  0  . vitamin E 100  UNIT capsule Take 1 capsule (100 Units total) by mouth daily.    Marland Kitchen zinc gluconate 50 MG tablet Take 1 tablet (50 mg total) by mouth daily.    . Adalimumab 40 MG/0.8ML PNKT Inject 40 mg into the skin once a week. On Fridays.    Marland Kitchen etanercept (ENBREL SURECLICK) 50 MG/ML injection INJECT THE CONTENTS OF 1 PEN INTO THE SKIN EVERY 7 DAYS.     No current facility-administered medications on file prior to visit.    No Known Allergies  Social History   Socioeconomic History  . Marital status: Married    Spouse name: Not on file  . Number of children: Not on file  . Years of education: Not on file  . Highest education level: Not on file  Occupational History  . Not on file  Tobacco Use  . Smoking status: Former Smoker    Packs/day: 1.00    Types: Cigarettes    Start date: 08/21/1980  . Smokeless tobacco: Former Network engineer and Sexual Activity  . Alcohol use: No    Alcohol/week: 0.0 standard drinks  . Drug use: No  . Sexual activity: Yes    Partners: Male    Birth  control/protection: None  Other Topics Concern  . Not on file  Social History Narrative  . Not on file   Social Determinants of Health   Financial Resource Strain:   . Difficulty of Paying Living Expenses:   Food Insecurity:   . Worried About Charity fundraiser in the Last Year:   . Arboriculturist in the Last Year:   Transportation Needs:   . Film/video editor (Medical):   Marland Kitchen Lack of Transportation (Non-Medical):   Physical Activity:   . Days of Exercise per Week:   . Minutes of Exercise per Session:   Stress:   . Feeling of Stress :   Social Connections:   . Frequency of Communication with Friends and Family:   . Frequency of Social Gatherings with Friends and Family:   . Attends Religious Services:   . Active Member of Clubs or Organizations:   . Attends Archivist Meetings:   Marland Kitchen Marital Status:   Intimate Partner Violence:   . Fear of Current or Ex-Partner:   . Emotionally Abused:   Marland Kitchen  Physically Abused:   . Sexually Abused:     Family History  Problem Relation Age of Onset  . Breast cancer Sister        58s  . Hyperlipidemia Sister   . Hypertension Sister   . Kidney disease Sister   . Arthritis Mother   . Hyperlipidemia Mother   . Hypertension Mother   . Stroke Mother   . Mental retardation Son   . Arthritis Maternal Grandmother   . Depression Sister     The following portions of the patient's history were reviewed and updated as appropriate: allergies, current medications, past family history, past medical history, past social history, past surgical history and problem list.  Review of Systems Pertinent items noted in HPI and remainder of comprehensive ROS otherwise negative.   Objective:  BP 122/84   Pulse (!) 102   Ht 5\' 2"  (1.575 m)   Wt 117.5 kg   LMP 10/07/2013   BMI 47.37 kg/m  CONSTITUTIONAL: Well-developed, well-nourished female in no acute distress.  HENT:  Normocephalic, atraumatic, External right and left ear normal. Oropharynx is clear and moist EYES: Conjunctivae and EOM are normal. Pupils are equal, round, and reactive to light. No scleral icterus.  NECK: Normal range of motion, supple, no masses.  Normal thyroid.  SKIN: Skin is warm and dry. No rash noted. Not diaphoretic. No erythema. No pallor. [place]: Alert and oriented to person, place, and time. Normal reflexes, muscle tone coordination. No cranial nerve deficit noted. PSYCHIATRIC: Normal mood and affect. Normal behavior. Normal judgment and thought content. CARDIOVASCULAR: Normal heart rate noted, regular rhythm RESPIRATORY: Clear to auscultation bilaterally. Effort and breath sounds normal, no problems with respiration noted. BREASTS: Symmetric in size. No masses, skin changes, nipple drainage, or lymphadenopathy. ABDOMEN: Soft, normal bowel sounds, no distention noted.  No tenderness, rebound or guarding.  PELVIC: Normal appearing external genitalia; normal appearing vaginal  mucosa and cervix.  No abnormal discharge noted.  Pap smear obtained.  Normal uterine size, no other palpable masses, no uterine or adnexal tenderness. MUSCULOSKELETAL: Normal range of motion. No tenderness.  No cyanosis, clubbing, or edema.  2+ distal pulses.   Assessment:  Annual gynecologic examination with pap smear   Plan:  Will follow up results of pap smear and manage accordingly. Mammogram scheduled as per PCP Routine preventative health maintenance measures emphasized. Please refer to After Visit Summary for  other counseling recommendations.    Chancy Milroy, MD, [place] Attending [place] for [facility], [place]

## 2019-04-10 NOTE — Progress Notes (Signed)
Patient presents for her Annual Exam today.   Last Pap: 09/12/2015 WNL per pt no Hx of abnormal paps.  Mammogram: 01/17/2018 WNL  Family Hx of Breast cancer : Half sister   CC: None

## 2019-04-10 NOTE — Patient Instructions (Signed)
Health Maintenance, Female Adopting a healthy lifestyle and getting preventive care are important in promoting health and wellness. Ask your health care provider about:  The right schedule for you to have regular tests and exams.  Things you can do on your own to prevent diseases and keep yourself healthy. What should I know about diet, weight, and exercise? Eat a healthy diet   Eat a diet that includes plenty of vegetables, fruits, low-fat dairy products, and lean protein.  Do not eat a lot of foods that are high in solid fats, added sugars, or sodium. Maintain a healthy weight Body mass index (BMI) is used to identify weight problems. It estimates body fat based on height and weight. Your health care provider can help determine your BMI and help you achieve or maintain a healthy weight. Get regular exercise Get regular exercise. This is one of the most important things you can do for your health. Most adults should:  Exercise for at least 150 minutes each week. The exercise should increase your heart rate and make you sweat (moderate-intensity exercise).  Do strengthening exercises at least twice a week. This is in addition to the moderate-intensity exercise.  Spend less time sitting. Even light physical activity can be beneficial. Watch cholesterol and blood lipids Have your blood tested for lipids and cholesterol at 57 years of age, then have this test every 5 years. Have your cholesterol levels checked more often if:  Your lipid or cholesterol levels are high.  You are older than 57 years of age.  You are at high risk for heart disease. What should I know about cancer screening? Depending on your health history and family history, you may need to have cancer screening at various ages. This may include screening for:  Breast cancer.  Cervical cancer.  Colorectal cancer.  Skin cancer.  Lung cancer. What should I know about heart disease, diabetes, and high blood  pressure? Blood pressure and heart disease  High blood pressure causes heart disease and increases the risk of stroke. This is more likely to develop in people who have high blood pressure readings, are of African descent, or are overweight.  Have your blood pressure checked: ? Every 3-5 years if you are 18-39 years of age. ? Every year if you are 40 years old or older. Diabetes Have regular diabetes screenings. This checks your fasting blood sugar level. Have the screening done:  Once every three years after age 40 if you are at a normal weight and have a low risk for diabetes.  More often and at a younger age if you are overweight or have a high risk for diabetes. What should I know about preventing infection? Hepatitis B If you have a higher risk for hepatitis B, you should be screened for this virus. Talk with your health care provider to find out if you are at risk for hepatitis B infection. Hepatitis C Testing is recommended for:  Everyone born from 1945 through 1965.  Anyone with known risk factors for hepatitis C. Sexually transmitted infections (STIs)  Get screened for STIs, including gonorrhea and chlamydia, if: ? You are sexually active and are younger than 57 years of age. ? You are older than 57 years of age and your health care provider tells you that you are at risk for this type of infection. ? Your sexual activity has changed since you were last screened, and you are at increased risk for chlamydia or gonorrhea. Ask your health care provider if   you are at risk.  Ask your health care provider about whether you are at high risk for HIV. Your health care provider may recommend a prescription medicine to help prevent HIV infection. If you choose to take medicine to prevent HIV, you should first get tested for HIV. You should then be tested every 3 months for as long as you are taking the medicine. Pregnancy  If you are about to stop having your period (premenopausal) and  you may become pregnant, seek counseling before you get pregnant.  Take 400 to 800 micrograms (mcg) of folic acid every day if you become pregnant.  Ask for birth control (contraception) if you want to prevent pregnancy. Osteoporosis and menopause Osteoporosis is a disease in which the bones lose minerals and strength with aging. This can result in bone fractures. If you are 65 years old or older, or if you are at risk for osteoporosis and fractures, ask your health care provider if you should:  Be screened for bone loss.  Take a calcium or vitamin D supplement to lower your risk of fractures.  Be given hormone replacement therapy (HRT) to treat symptoms of menopause. Follow these instructions at home: Lifestyle  Do not use any products that contain nicotine or tobacco, such as cigarettes, e-cigarettes, and chewing tobacco. If you need help quitting, ask your health care provider.  Do not use street drugs.  Do not share needles.  Ask your health care provider for help if you need support or information about quitting drugs. Alcohol use  Do not drink alcohol if: ? Your health care provider tells you not to drink. ? You are pregnant, may be pregnant, or are planning to become pregnant.  If you drink alcohol: ? Limit how much you use to 0-1 drink a day. ? Limit intake if you are breastfeeding.  Be aware of how much alcohol is in your drink. In the [place] one drink equals one 12 oz bottle of beer (355 mL), one 5 oz glass of wine (148 mL), or one 1 oz glass of hard liquor (44 mL). General instructions  Schedule regular health, dental, and eye exams.  Stay current with your vaccines.  Tell your health care provider if: ? You often feel depressed. ? You have ever been abused or do not feel safe at home. Summary  Adopting a healthy lifestyle and getting preventive care are important in promoting health and wellness.  Follow your health care provider's instructions about healthy  diet, exercising, and getting tested or screened for diseases.  Follow your health care provider's instructions on monitoring your cholesterol and blood pressure. This information is not intended to replace advice given to you by your health care provider. Make sure you discuss any questions you have with your health care provider. Document Revised: 01/08/2018 Document Reviewed: 01/08/2018 Elsevier Patient Education  2020 [employer]  

## 2019-04-14 ENCOUNTER — Other Ambulatory Visit: Payer: Self-pay

## 2019-04-14 ENCOUNTER — Ambulatory Visit
Admission: RE | Admit: 2019-04-14 | Discharge: 2019-04-14 | Disposition: A | Discharge status: Home / Self Care | Payer: Medicare Other | Source: Ambulatory Visit | Attending: Family Medicine | Admitting: Family Medicine

## 2019-04-14 DIAGNOSIS — Z1231 Encounter for screening mammogram for malignant neoplasm of breast: Secondary | ICD-10-CM

## 2019-04-18 IMAGING — MG DIGITAL SCREENING BILATERAL MAMMOGRAM WITH TOMO AND CAD
8 series · 8 of 24 positions shown · non-contrast
Comparison: Previous exam(s).

CLINICAL DATA: Screening.

EXAM:
DIGITAL SCREENING BILATERAL MAMMOGRAM WITH TOMO AND CAD

[L CC synth-2D]
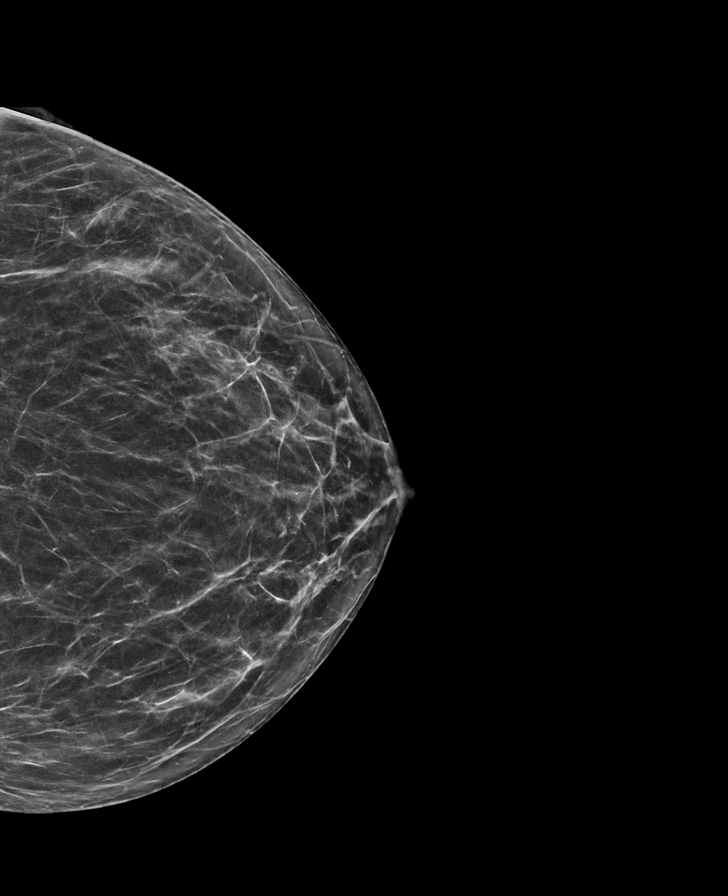

[R MLO synth-2D]
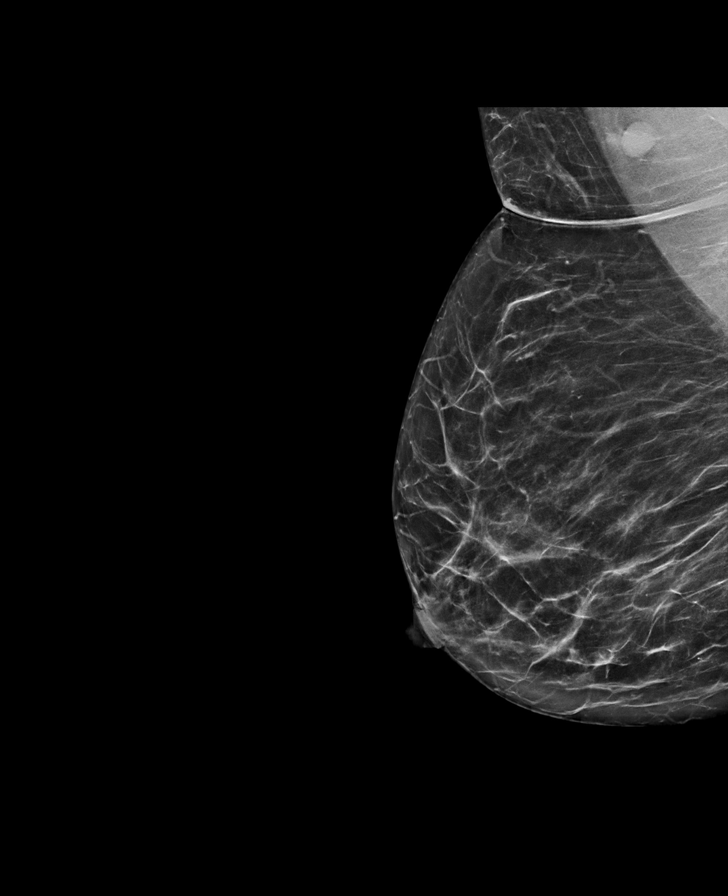

[L MLO synth-2D]
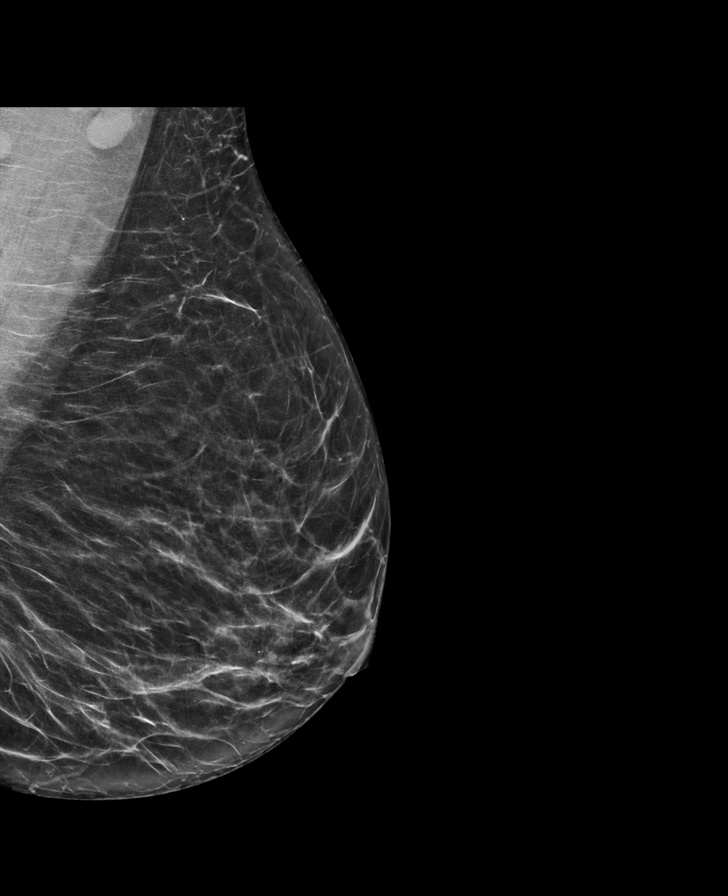

[R CC synth-2D]
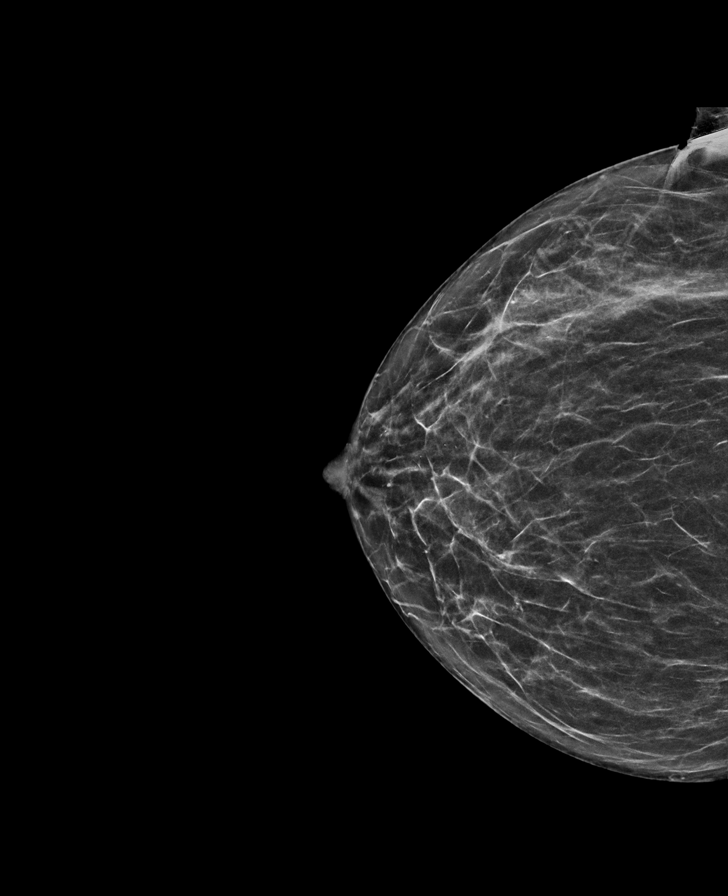

[L MLO tomo · tomo slice 36/71.0]
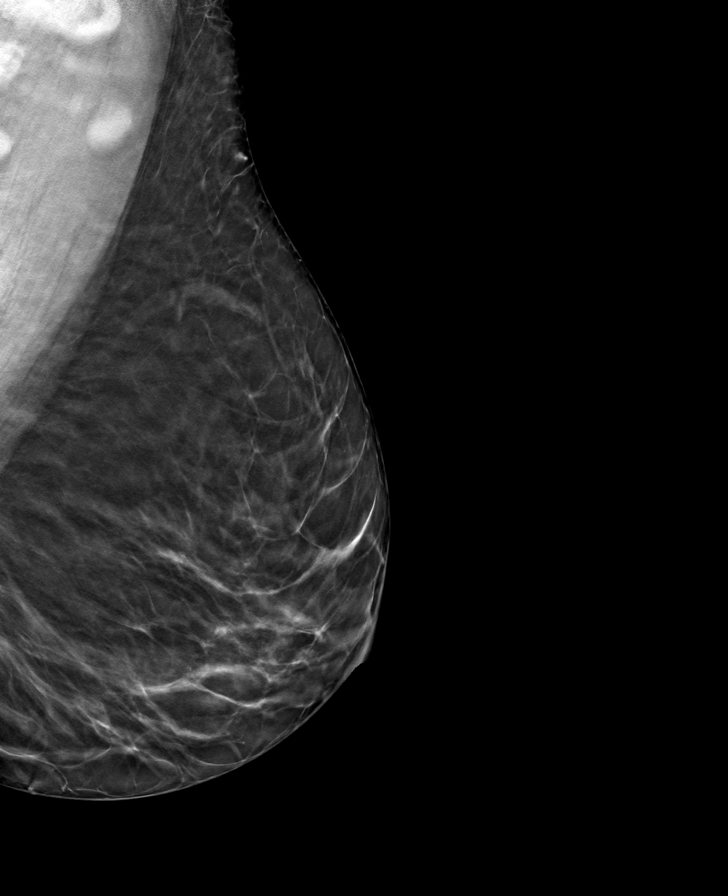

[L CC tomo · tomo slice 33/64.0]
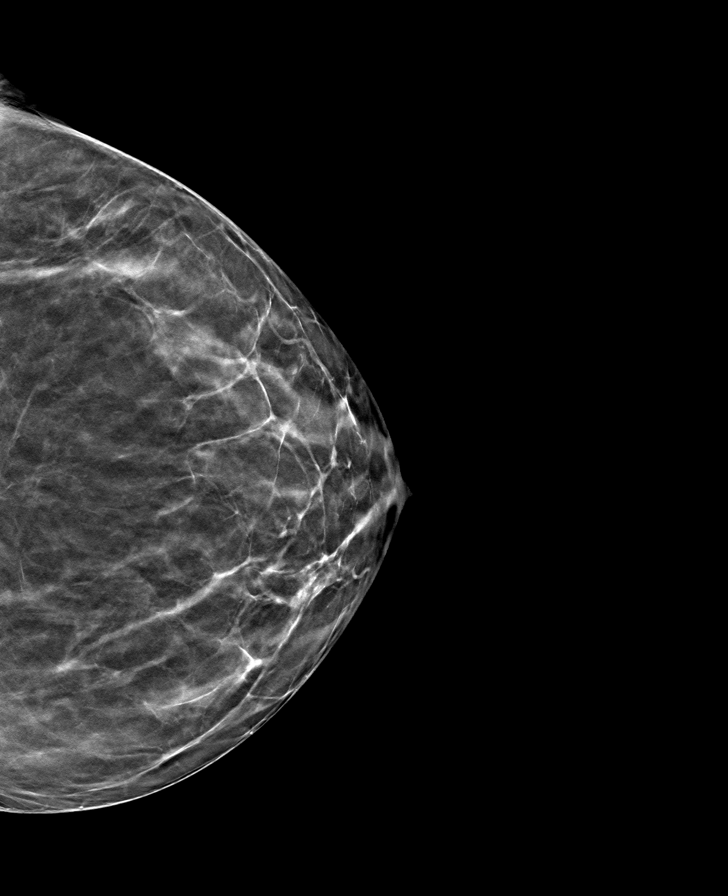

[R MLO tomo · tomo slice 37/73.0]
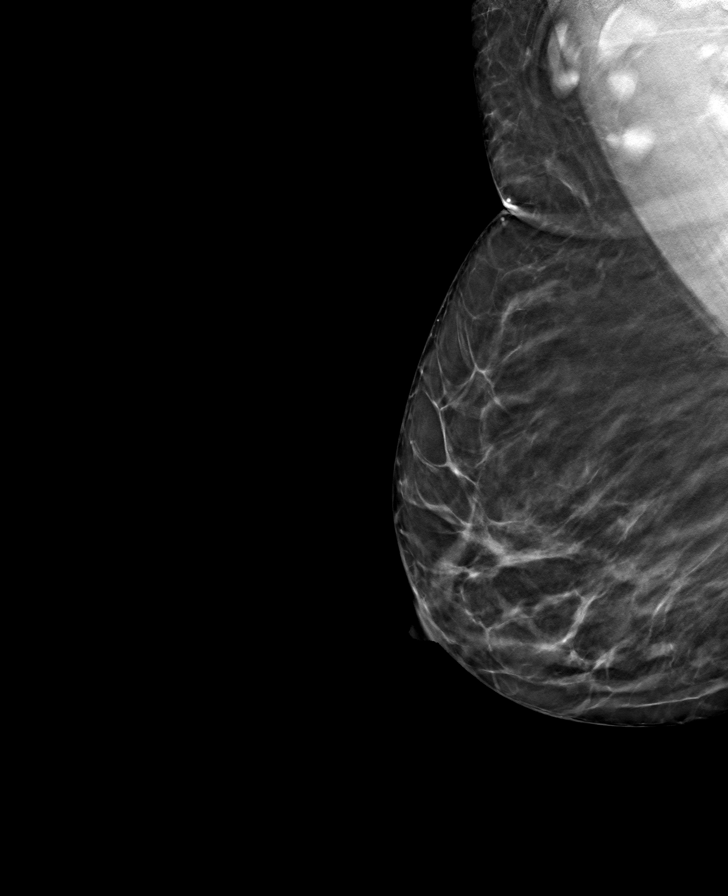

[R CC tomo · tomo slice 29/58.0]
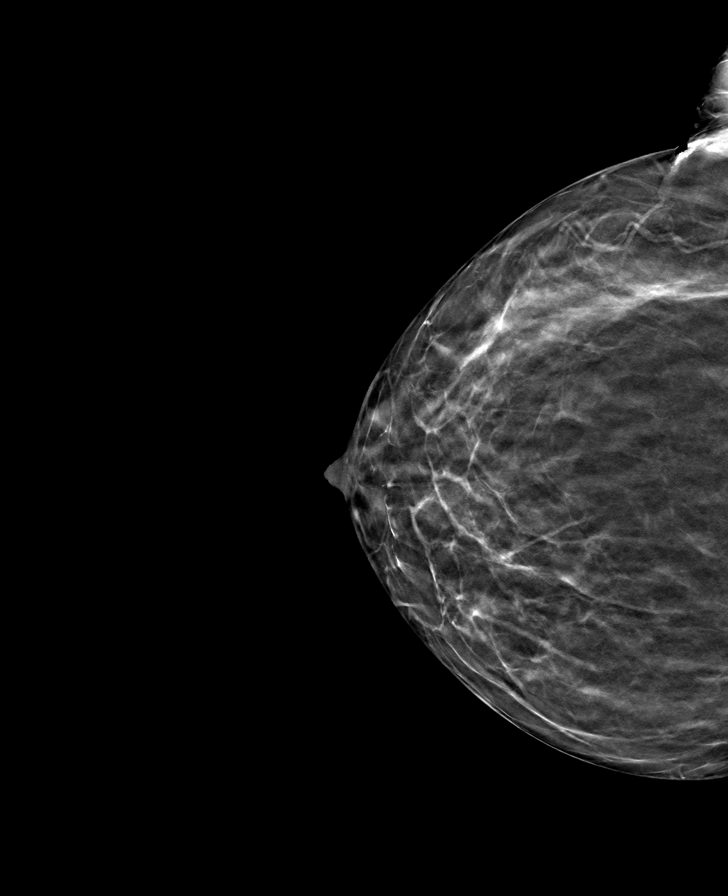

[8 of 24 positions shown; findings below may reference images not displayed]

ACR Breast Density Category b: There are scattered areas of
fibroglandular density.
FINDINGS: There are no findings suspicious for malignancy. Images were
processed with CAD.
IMPRESSION: No mammographic evidence of malignancy. A result letter of this
screening mammogram will be mailed directly to the patient.

RECOMMENDATION:
Screening mammogram in one year. (Code:[id number])

BI-RADS CATEGORY  1: Negative.

## 2019-04-24 ENCOUNTER — Telehealth: Payer: Self-pay | Admitting: Family Medicine

## 2019-04-24 NOTE — Chronic Care Management (AMB) (Signed)
  Chronic Care Management   Note  04/24/2019 Name: Debra Everett MRN: [id number] DOB: Mar 04, 1960  Debra Everett is a 59 y.o. year old female who is a primary care patient of Ann Held, DO. I reached out to [name] by phone today in response to a referral sent by [name]'s PCP, Carollee Herter, Alferd Apa, DO.   Debra Everett was given information about Chronic Care Management services today including:  1. CCM service includes personalized support from designated clinical staff supervised by her physician, including individualized plan of care and coordination with other care providers 2. 24/7 contact phone numbers for assistance for urgent and routine care needs. 3. Service will only be billed when office clinical staff spend 20 minutes or more in a month to coordinate care. 4. Only one practitioner may furnish and bill the service in a calendar month. 5. The patient may stop CCM services at any time (effective at the end of the month) by phone call to the office staff.   Patient agreed to services and verbal consent obtained.   Follow up plan:   Raynicia Dukes [employer] Scheduler

## 2019-04-29 LAB — CYTOLOGY - PAP
Adequacy: ABNORMAL
Comment: NEGATIVE
High risk HPV: NEGATIVE

## 2019-05-03 ENCOUNTER — Other Ambulatory Visit: Payer: Self-pay | Admitting: Family Medicine

## 2019-05-03 DIAGNOSIS — F419 Anxiety disorder, unspecified: Secondary | ICD-10-CM

## 2019-05-05 NOTE — Telephone Encounter (Signed)
Requesting: Alprazolam Contract: 11.22.2019 UDS: 11.22.2019 Last OV: 11.13.20 Next OV: Not scheduled Last Refill: 3.9.2021   Please advise

## 2019-05-13 ENCOUNTER — Telehealth: Payer: Self-pay

## 2019-05-13 NOTE — Telephone Encounter (Signed)
Returned call, advised of appt and need for repeat pap

## 2019-05-18 ENCOUNTER — Ambulatory Visit: Payer: Medicaid Other | Admitting: Obstetrics and Gynecology

## 2019-05-19 ENCOUNTER — Telehealth: Payer: Medicaid Other

## 2020-12-05 ENCOUNTER — Other Ambulatory Visit: Payer: Self-pay | Admitting: Family Medicine

## 2020-12-05 DIAGNOSIS — Z1231 Encounter for screening mammogram for malignant neoplasm of breast: Secondary | ICD-10-CM

## 2021-01-06 ENCOUNTER — Ambulatory Visit
Admission: RE | Admit: 2021-01-06 | Discharge: 2021-01-06 | Disposition: A | Discharge status: Home / Self Care | Payer: Medicare Other | Source: Ambulatory Visit | Attending: Family Medicine | Admitting: Family Medicine

## 2021-01-06 DIAGNOSIS — Z1231 Encounter for screening mammogram for malignant neoplasm of breast: Secondary | ICD-10-CM

## 2021-01-10 ENCOUNTER — Other Ambulatory Visit: Payer: Self-pay | Admitting: Family Medicine

## 2021-01-10 DIAGNOSIS — R928 Other abnormal and inconclusive findings on diagnostic imaging of breast: Secondary | ICD-10-CM

## 2021-02-17 ENCOUNTER — Ambulatory Visit
Admission: RE | Admit: 2021-02-17 | Discharge: 2021-02-17 | Disposition: A | Discharge status: Home / Self Care | Payer: Medicare Other | Source: Ambulatory Visit | Attending: Family Medicine | Admitting: Family Medicine

## 2021-02-17 ENCOUNTER — Ambulatory Visit: Admission: RE | Admit: 2021-02-17 | Payer: Medicare Other | Source: Ambulatory Visit

## 2021-02-17 DIAGNOSIS — R928 Other abnormal and inconclusive findings on diagnostic imaging of breast: Secondary | ICD-10-CM

## 2022-10-03 ENCOUNTER — Other Ambulatory Visit (HOSPITAL_COMMUNITY): Payer: Self-pay

## 2022-10-03 MED ORDER — MOUNJARO 5 MG/0.5ML ~~LOC~~ SOAJ
5.0000 mg | SUBCUTANEOUS | 0 refills | Status: AC
  Filled 2022-10-03: qty 2, 28d supply, fill #0

## 2022-10-16 ENCOUNTER — Other Ambulatory Visit: Payer: Self-pay | Admitting: Nurse Practitioner

## 2022-10-16 DIAGNOSIS — Z1231 Encounter for screening mammogram for malignant neoplasm of breast: Secondary | ICD-10-CM

## 2022-10-17 ENCOUNTER — Ambulatory Visit
Admission: RE | Admit: 2022-10-17 | Discharge: 2022-10-17 | Disposition: A | Discharge status: Home / Self Care | Payer: 59 | Source: Ambulatory Visit | Attending: Nurse Practitioner | Admitting: Nurse Practitioner

## 2022-10-17 DIAGNOSIS — Z1231 Encounter for screening mammogram for malignant neoplasm of breast: Secondary | ICD-10-CM

## 2022-10-31 ENCOUNTER — Other Ambulatory Visit (HOSPITAL_COMMUNITY): Payer: Self-pay

## 2022-10-31 MED ORDER — MOUNJARO 7.5 MG/0.5ML ~~LOC~~ SOPN
7.5000 mg | PEN_INJECTOR | SUBCUTANEOUS | 0 refills | Status: AC
Start: 2022-10-31 — End: ?
  Filled 2022-10-31: qty 2, 28d supply, fill #0

## 2022-11-26 ENCOUNTER — Other Ambulatory Visit (HOSPITAL_COMMUNITY): Payer: Self-pay

## 2022-11-26 MED ORDER — MOUNJARO 10 MG/0.5ML ~~LOC~~ SOAJ
10.0000 mg | SUBCUTANEOUS | 0 refills | Status: AC
  Filled 2022-11-26: qty 2, 28d supply, fill #0

## 2022-12-18 ENCOUNTER — Other Ambulatory Visit (HOSPITAL_COMMUNITY): Payer: Self-pay

## 2022-12-18 MED ORDER — MOUNJARO 12.5 MG/0.5ML ~~LOC~~ SOAJ
12.5000 mg | SUBCUTANEOUS | 0 refills | Status: AC
  Filled 2022-12-18: qty 2, 28d supply, fill #0

## 2023-01-08 ENCOUNTER — Other Ambulatory Visit (HOSPITAL_COMMUNITY): Payer: Self-pay

## 2023-01-08 MED ORDER — OXYCODONE HCL 15 MG PO TABS
15.0000 mg | ORAL_TABLET | Freq: Every day | ORAL | 0 refills | Status: AC
Start: 2023-01-07 — End: ?
  Filled 2023-01-08: qty 150, 30d supply, fill #0

## 2023-01-08 MED ORDER — ALPRAZOLAM 0.25 MG PO TABS
0.2500 mg | ORAL_TABLET | Freq: Every day | ORAL | 0 refills | Status: AC
Start: 2023-01-07 — End: ?
  Filled 2023-01-08: qty 10, 30d supply, fill #0

## 2023-01-10 ENCOUNTER — Other Ambulatory Visit (HOSPITAL_COMMUNITY): Payer: Self-pay

## 2023-01-10 MED ORDER — MOUNJARO 15 MG/0.5ML ~~LOC~~ SOAJ
15.0000 mg | SUBCUTANEOUS | 1 refills | Status: AC
Start: 2023-01-10 — End: ?
  Filled 2023-01-10: qty 6, 84d supply, fill #0
  Filled 2023-07-03: qty 6, 84d supply, fill #1

## 2023-04-12 ENCOUNTER — Other Ambulatory Visit (HOSPITAL_COMMUNITY): Payer: Self-pay

## 2023-04-12 MED ORDER — MOUNJARO 15 MG/0.5ML ~~LOC~~ SOAJ
15.0000 mg | SUBCUTANEOUS | 1 refills | Status: AC
  Filled 2023-04-12: qty 6, 84d supply, fill #0

## 2023-05-08 ENCOUNTER — Other Ambulatory Visit (HOSPITAL_COMMUNITY): Payer: Self-pay

## 2023-05-08 MED ORDER — KETOCONAZOLE 2 % EX CREA
TOPICAL_CREAM | Freq: Every day | CUTANEOUS | 1 refills | Status: AC
  Filled 2023-05-09: qty 45, 30d supply, fill #0
  Filled 2023-07-03: qty 45, fill #0
  Filled 2023-07-03: qty 45, 30d supply, fill #0
  Filled 2023-07-03: qty 60, 30d supply, fill #0
  Filled 2023-07-29: qty 60, 30d supply, fill #1

## 2023-05-09 ENCOUNTER — Other Ambulatory Visit (HOSPITAL_COMMUNITY): Payer: Self-pay

## 2023-07-03 ENCOUNTER — Other Ambulatory Visit (HOSPITAL_COMMUNITY): Payer: Self-pay

## 2023-07-03 ENCOUNTER — Other Ambulatory Visit: Payer: Self-pay

## 2023-08-01 ENCOUNTER — Other Ambulatory Visit (HOSPITAL_COMMUNITY): Payer: Self-pay

## 2023-08-12 ENCOUNTER — Other Ambulatory Visit (HOSPITAL_COMMUNITY): Payer: Self-pay

## 2023-08-12 MED ORDER — MOUNJARO 15 MG/0.5ML ~~LOC~~ SOAJ
15.0000 mg | SUBCUTANEOUS | 1 refills | Status: AC
  Filled 2023-08-12: qty 6, 84d supply, fill #0

## 2023-09-03 ENCOUNTER — Other Ambulatory Visit (HOSPITAL_COMMUNITY): Payer: Self-pay

## 2023-09-03 MED ORDER — MOUNJARO 15 MG/0.5ML ~~LOC~~ SOAJ
15.0000 mg | SUBCUTANEOUS | 1 refills | Status: AC
  Filled 2023-09-03 – 2023-09-04 (×3): qty 6, 84d supply, fill #0

## 2023-09-04 ENCOUNTER — Other Ambulatory Visit (HOSPITAL_COMMUNITY): Payer: Self-pay

## 2023-09-04 MED ORDER — OXYCODONE HCL 15 MG PO TABS
15.0000 mg | ORAL_TABLET | Freq: Every day | ORAL | 0 refills | Status: AC
  Filled 2023-09-05: qty 150, 30d supply, fill #0

## 2023-09-04 MED ORDER — ALPRAZOLAM 0.25 MG PO TABS
0.2500 mg | ORAL_TABLET | Freq: Every day | ORAL | 0 refills | Status: AC | PRN
  Filled 2023-09-05: qty 30, 30d supply, fill #0

## 2023-09-05 ENCOUNTER — Other Ambulatory Visit (HOSPITAL_COMMUNITY): Payer: Self-pay

## 2023-11-18 ENCOUNTER — Other Ambulatory Visit (HOSPITAL_COMMUNITY): Payer: Self-pay

## 2023-11-18 MED ORDER — MOUNJARO 15 MG/0.5ML ~~LOC~~ SOAJ
15.0000 mg | SUBCUTANEOUS | 1 refills | Status: AC
  Filled 2023-11-18: qty 6, 84d supply, fill #0
